# Patient Record
Sex: Male | Born: 1960 | Race: White | Hispanic: No | Marital: Single | Smoking: Current every day smoker
Health system: Southern US, Community
[De-identification: ages and names within clinical notes are randomized; demographics above are authoritative.]

---

## 2015-04-28 ENCOUNTER — Encounter: Payer: Self-pay | Admitting: Emergency Medicine

## 2015-04-28 ENCOUNTER — Inpatient Hospital Stay
Admission: EM | Admit: 2015-04-28 | Discharge: 2015-05-02 | DRG: 872 | Disposition: A | Discharge status: Home / Self Care | Payer: Self-pay | Attending: Internal Medicine | Admitting: Internal Medicine

## 2015-04-28 DIAGNOSIS — A408 Other streptococcal sepsis: Principal | ICD-10-CM | POA: Diagnosis present

## 2015-04-28 DIAGNOSIS — L03116 Cellulitis of left lower limb: Secondary | ICD-10-CM | POA: Insufficient documentation

## 2015-04-28 DIAGNOSIS — M869 Osteomyelitis, unspecified: Secondary | ICD-10-CM

## 2015-04-28 DIAGNOSIS — Z8249 Family history of ischemic heart disease and other diseases of the circulatory system: Secondary | ICD-10-CM

## 2015-04-28 DIAGNOSIS — F1020 Alcohol dependence, uncomplicated: Secondary | ICD-10-CM | POA: Diagnosis present

## 2015-04-28 DIAGNOSIS — L02416 Cutaneous abscess of left lower limb: Secondary | ICD-10-CM | POA: Diagnosis present

## 2015-04-28 DIAGNOSIS — Z833 Family history of diabetes mellitus: Secondary | ICD-10-CM

## 2015-04-28 DIAGNOSIS — L0390 Cellulitis, unspecified: Secondary | ICD-10-CM | POA: Diagnosis present

## 2015-04-28 DIAGNOSIS — F17210 Nicotine dependence, cigarettes, uncomplicated: Secondary | ICD-10-CM | POA: Diagnosis present

## 2015-04-28 DIAGNOSIS — A419 Sepsis, unspecified organism: Secondary | ICD-10-CM | POA: Insufficient documentation

## 2015-04-28 LAB — COMPREHENSIVE METABOLIC PANEL
ALT: 27 U/L (ref 17–63)
AST: 23 U/L (ref 15–41)
Albumin: 4 g/dL (ref 3.5–5.0)
Alkaline Phosphatase: 93 U/L (ref 38–126)
Anion gap: 9 (ref 5–15)
BILIRUBIN TOTAL: 1.2 mg/dL (ref 0.3–1.2)
BUN: 9 mg/dL (ref 6–20)
CALCIUM: 9.2 mg/dL (ref 8.9–10.3)
CO2: 26 mmol/L (ref 22–32)
Chloride: 103 mmol/L (ref 101–111)
Creatinine, Ser: 0.87 mg/dL (ref 0.61–1.24)
GFR calc Af Amer: >60 mL/min (ref >60)
GFR calc non Af Amer: >60 mL/min (ref >60)
GLUCOSE: 110 mg/dL — AB (ref 65–99)
Potassium: 3.6 mmol/L (ref 3.5–5.1)
Sodium: 138 mmol/L (ref 135–145)
Total Protein: 7.5 g/dL (ref 6.5–8.1)

## 2015-04-28 LAB — CBC WITH DIFFERENTIAL/PLATELET
BASOS ABS: 0 10*3/uL (ref 0–0.1)
Basophils Relative: 0 %
EOS PCT: 0 %
Eosinophils Absolute: 0 10*3/uL (ref 0–0.7)
HCT: 43.6 % (ref 40.0–52.0)
HEMOGLOBIN: 14.5 g/dL (ref 13.0–18.0)
Lymphocytes Relative: 10 %
Lymphs Abs: 1.6 10*3/uL (ref 1.0–3.6)
MCH: 30.3 pg (ref 26.0–34.0)
MCHC: 33.4 g/dL (ref 32.0–36.0)
MCV: 90.8 fL (ref 80.0–100.0)
MONOS PCT: 8 %
Monocytes Absolute: 1.3 10*3/uL — ABNORMAL HIGH (ref 0.2–1.0)
NEUTROS ABS: 12.8 10*3/uL — AB (ref 1.4–6.5)
Neutrophils Relative %: 82 %
Platelets: 276 10*3/uL (ref 150–440)
RBC: 4.79 MIL/uL (ref 4.40–5.90)
RDW: 13.5 % (ref 11.5–14.5)
WBC: 15.7 10*3/uL — ABNORMAL HIGH (ref 3.8–10.6)

## 2015-04-28 LAB — LACTIC ACID, PLASMA: LACTIC ACID, VENOUS: 1.4 mmol/L (ref 0.5–2.0)

## 2015-04-28 MED ORDER — SODIUM CHLORIDE 0.9 % IV SOLN
INTRAVENOUS | Status: AC
  Administered 2015-04-28: 23:00:00 via INTRAVENOUS

## 2015-04-28 MED ORDER — ACETAMINOPHEN 325 MG PO TABS: 650.0000 mg | ORAL_TABLET | Freq: Four times a day (QID) | ORAL | Status: DC | PRN

## 2015-04-28 MED ORDER — CLINDAMYCIN PHOSPHATE 600 MG/50ML IV SOLN
600.0000 mg | Freq: Once | INTRAVENOUS | Status: AC
  Administered 2015-04-28: 600 mg via INTRAVENOUS
  Filled 2015-04-28: qty 50

## 2015-04-28 MED ORDER — CLINDAMYCIN PHOSPHATE 600 MG/50ML IV SOLN
600.0000 mg | Freq: Three times a day (TID) | INTRAVENOUS | Status: DC
  Administered 2015-04-28: 600 mg via INTRAVENOUS
  Filled 2015-04-28 (×3): qty 50

## 2015-04-28 MED ORDER — ONDANSETRON HCL 4 MG/2ML IJ SOLN: 4.0000 mg | Freq: Four times a day (QID) | INTRAMUSCULAR | Status: DC | PRN

## 2015-04-28 MED ORDER — ONDANSETRON HCL 4 MG PO TABS: 4.0000 mg | ORAL_TABLET | Freq: Four times a day (QID) | ORAL | Status: DC | PRN

## 2015-04-28 MED ORDER — ALUM & MAG HYDROXIDE-SIMETH 200-200-20 MG/5ML PO SUSP: 30.0000 mL | Freq: Four times a day (QID) | ORAL | Status: DC | PRN

## 2015-04-28 MED ORDER — SODIUM CHLORIDE 0.9 % IV BOLUS (SEPSIS)
1000.0000 mL | Freq: Once | INTRAVENOUS | Status: AC
  Administered 2015-04-28: 1000 mL via INTRAVENOUS

## 2015-04-28 MED ORDER — ACETAMINOPHEN 650 MG RE SUPP: 650.0000 mg | Freq: Four times a day (QID) | RECTAL | Status: DC | PRN

## 2015-04-28 MED ORDER — HEPARIN SODIUM (PORCINE) 5000 UNIT/ML IJ SOLN
5000.0000 [IU] | Freq: Three times a day (TID) | INTRAMUSCULAR | Status: DC
  Administered 2015-04-28 – 2015-05-02 (×11): 5000 [IU] via SUBCUTANEOUS
  Filled 2015-04-28 (×11): qty 1

## 2015-04-28 MED ORDER — SENNOSIDES-DOCUSATE SODIUM 8.6-50 MG PO TABS
1.0000 | ORAL_TABLET | Freq: Every evening | ORAL | Status: DC | PRN
  Administered 2015-05-02: 1 via ORAL
  Filled 2015-04-28: qty 1

## 2015-04-28 MED ORDER — HYDROCODONE-ACETAMINOPHEN 5-325 MG PO TABS
1.0000 | ORAL_TABLET | ORAL | Status: DC | PRN
  Administered 2015-05-01 – 2015-05-02 (×2): 2 via ORAL
  Filled 2015-04-28 (×2): qty 2

## 2015-04-28 MED ORDER — SODIUM CHLORIDE 0.9 % IV BOLUS (SEPSIS)
1267.0000 mL | Freq: Once | INTRAVENOUS | Status: AC
  Administered 2015-04-28: 1267 mL via INTRAVENOUS

## 2015-04-28 MED ORDER — SODIUM CHLORIDE 0.9 % IV BOLUS (SEPSIS)
1000.0000 mL | Freq: Once | INTRAVENOUS | Status: AC
Start: 2015-04-28 — End: 2015-04-28
  Administered 2015-04-28: 1000 mL via INTRAVENOUS

## 2015-04-28 NOTE — Progress Notes (Signed)
   04/28/15 1800  Clinical Encounter Type  Visited With Patient  Visit Type Spiritual support  Spiritual Encounters  Spiritual Needs Emotional  Stress Factors  Patient Stress Factors Health changes   Status: Cellulitis, 54 yr old male Family: brother in [place] Age/Sex: male 52 yrs Visit Assessment: The chaplain visited with patient. He shared that he was not the type to sit around and that he is hungry. His sentiments were shared with the nursing station.  Pastoral Care: [phone] pager or by available online

## 2015-04-28 NOTE — Consult Note (Signed)
Surgical Consultation  04/28/2015  Keith Johnston is an 54 y.o. male.   CC: Left leg cellulitis  HPI:  This patient with a history of a fall on Saturday night landing on a concrete step and scraping his anterior left leg. He states that he was able to work on Sunday and Monday but he started having worsening pain and redness and came to the emergency room where her cellulitis was diagnosed. Fevers or chills since it is hard to stand on the leg. She had an episode like this before.  History reviewed. No pertinent past medical history.  History reviewed. No pertinent past surgical history.  No family history on file.  Social History:  reports that he has been smoking Cigarettes.  He has been smoking about 1.00 pack per day. He does not have any smokeless tobacco history on file. He reports that he drinks about 7.2 oz of alcohol per week. His drug history is not on file.  Allergies: No Known Allergies  Medications reviewed.   Review of Systems:   Review of Systems  Constitutional: Negative for fever and chills.  HENT: Negative.   Eyes: Negative.   Respiratory: Negative.   Cardiovascular: Negative.   Gastrointestinal: Negative.   Genitourinary: Negative.   Musculoskeletal: Negative.   Skin:       Redness of the left anterior leg  Neurological: Negative.  Negative for weakness.  Endo/Heme/Allergies: Negative.   Psychiatric/Behavioral: Negative.      Physical Exam:  BP 161/96 mmHg  Pulse 97  Temp(Src) 98.4 F (36.9 C) (Oral)  Resp 18  Ht '6\' 6"'  (1.981 m)  Wt 240 lb (108.863 kg)  BMI 27.74 kg/m2  SpO2 100%  Physical Exam  Constitutional: He is well-developed, well-nourished, and in no distress.  HENT:  Head: Normocephalic and atraumatic.  Mild strabismus left  Eyes: No scleral icterus.  Cardiovascular: Normal rate and regular rhythm.   Pulmonary/Chest: Effort normal. No respiratory distress.  Abdominal: Soft. He exhibits no distension.  Musculoskeletal: He exhibits  edema and tenderness.  Left anterior leg with 2 half to 3 cm abrasion minimal purulence no expressible purulence very superficial in nature. He also has considerable redness and erythema extending circumferentially around the lower leg from the ankle to just above the knee there is no lymphangitis in the medial side of the thigh. Calf is nontender negative Homans sign moderate edema  Skin: Skin is warm. No rash noted. There is erythema. No pallor.  Psychiatric: Mood, affect and judgment normal.      Results for orders placed or performed during the hospital encounter of 04/28/15 (from the past 48 hour(s))  CBC with Differential     Status: Abnormal   Collection Time: 04/28/15  3:37 PM  Result Value Ref Range   WBC 15.7 (H) 3.8 - 10.6 K/uL   RBC 4.79 4.40 - 5.90 MIL/uL   Hemoglobin 14.5 13.0 - 18.0 g/dL   HCT 43.6 40.0 - 52.0 %   MCV 90.8 80.0 - 100.0 fL   MCH 30.3 26.0 - 34.0 pg   MCHC 33.4 32.0 - 36.0 g/dL   RDW 13.5 11.5 - 14.5 %   Platelets 276 150 - 440 K/uL   Neutrophils Relative % 82 %   Neutro Abs 12.8 (H) 1.4 - 6.5 K/uL   Lymphocytes Relative 10 %   Lymphs Abs 1.6 1.0 - 3.6 K/uL   Monocytes Relative 8 %   Monocytes Absolute 1.3 (H) 0.2 - 1.0 K/uL   Eosinophils Relative 0 %  Eosinophils Absolute 0.0 0 - 0.7 K/uL   Basophils Relative 0 %   Basophils Absolute 0.0 0 - 0.1 K/uL  Comprehensive metabolic panel     Status: Abnormal   Collection Time: 04/28/15  3:37 PM  Result Value Ref Range   Sodium 138 135 - 145 mmol/L   Potassium 3.6 3.5 - 5.1 mmol/L   Chloride 103 101 - 111 mmol/L   CO2 26 22 - 32 mmol/L   Glucose, Bld 110 (H) 65 - 99 mg/dL   BUN 9 6 - 20 mg/dL   Creatinine, Ser 0.87 0.61 - 1.24 mg/dL   Calcium 9.2 8.9 - 10.3 mg/dL   Total Protein 7.5 6.5 - 8.1 g/dL   Albumin 4.0 3.5 - 5.0 g/dL   AST 23 15 - 41 U/L   ALT 27 17 - 63 U/L   Alkaline Phosphatase 93 38 - 126 U/L   Total Bilirubin 1.2 0.3 - 1.2 mg/dL   GFR calc non Af Amer >60 >60 mL/min   GFR calc Af  Amer >60 >60 mL/min    Comment: (NOTE) The eGFR has been calculated using the CKD EPI equation. This calculation has not been validated in all clinical situations. eGFR's persistently <60 mL/min signify possible Chronic Kidney Disease.    Anion gap 9 5 - 15  Lactic acid, plasma     Status: None   Collection Time: 04/28/15  5:38 PM  Result Value Ref Range   Lactic Acid, Venous 1.4 0.5 - 2.0 mmol/L   No results found.  Assessment/Plan:  Cellulitis of the left anterior leg secondary to a fall. This is a very superficial wound and I see no sign of a deep infection will range of motion is normal. I agree with IV anabiotic's and observation at this point with elevation of his leg we will follow him while he is in the hospital.  Florene Glen, MD, FACS

## 2015-04-28 NOTE — ED Provider Notes (Signed)
-----------------------------------------   5:51 PM on 04/28/2015 -----------------------------------------  Care was assumed from NP Triplett. Patient with sepsis secondary to left lower extremity cellulitis. Vitals improving with 30 ml/kg bolus normal saline, clindamycin. No evidence to suggest abscess. No evidence to suggest acute arterial thrombosis/vascular compromise. Case discussed with hospitalist for admission at this time.  Gayla Doss, MD 04/28/15 [phone]

## 2015-04-28 NOTE — ED Provider Notes (Signed)
[facility] Emergency Department Provider Note ____________________________________________  Time seen: Approximately 3:32 PM  I have reviewed the triage vital signs and the nursing notes.   HISTORY  Chief Complaint Wound Infection   HPI Keith Johnston is a 54 y.o. male who presents to the emergency department for evaluation of an infection of his left lower extremity. He states that he hit his leg on the corner of concrete a few days ago. He states that the leg was painful but he didn't think much about it until yesterday when he noticed that it had become swollen and red. He states that today the area has started to drain foul-smelling pus. He states that his entire lower extremity is painful.   History reviewed. No pertinent past medical history.  Patient Active Problem List   Diagnosis Date Noted  . Cellulitis 04/28/2015    History reviewed. No pertinent past surgical history.  No current outpatient prescriptions on file.  Allergies Review of patient's allergies indicates no known allergies.  No family history on file.  Social History History  Substance Use Topics  . Smoking status: Current Every Day Smoker -- 1.00 packs/day    Types: Cigarettes  . Smokeless tobacco: Not on file  . Alcohol Use: 7.2 oz/week    12 Cans of beer per week    Review of Systems   Constitutional: No fever/chills Eyes: No visual changes. ENT: No congestion or rhinorrhea Cardiovascular: Denies chest pain. Respiratory: Denies shortness of breath. Gastrointestinal: No abdominal pain.  No nausea, no vomiting.  No diarrhea.  No constipation. Genitourinary: Negative for dysuria. Musculoskeletal: Negative for back pain. Skin: Infection to the left lower extremity Neurological: Negative for headaches, focal weakness or numbness.  10-point ROS otherwise negative.  ____________________________________________   PHYSICAL EXAM:  VITAL SIGNS: ED Triage Vitals   Enc Vitals Group     BP --      Pulse --      Resp --      Temp --      Temp src --      SpO2 --      Weight --      Height --      Head Cir --      Peak Flow --      Pain Score 04/28/15 1419 9     Pain Loc --      Pain Edu? --      Excl. in GC? --    Constitutional: Alert and oriented. Well appearing and in no acute distress. Eyes: Conjunctivae are normal. PERRL. EOMI. Head: Atraumatic. Nose: No congestion/rhinnorhea. Mouth/Throat: Mucous membranes are moist.  Oropharynx non-erythematous. No oral lesions. Neck: No stridor. Cardiovascular: Normal rate, regular rhythm.  Good peripheral circulation. Respiratory: Normal respiratory effort.  No retractions. Lungs CTAB. Gastrointestinal: Soft and nontender. No distention. No abdominal bruits.  Musculoskeletal:  No joint effusions. Limited ROM of LLE due to swelling and pain. Swelling extends from below knee to toes. Neurologic:  Normal speech and language. No gross focal neurologic deficits are appreciated. Speech is normal. No gait instability. Skin:  Erythematous, taught below knee with obvious cellulitis. Wound to the pretibial area open and draining purulent and serous fluid.  Psychiatric: Mood and affect are normal. Speech and behavior are normal.  ____________________________________________   LABS (all labs ordered are listed, but only abnormal results are displayed)  Labs Reviewed  CBC WITH DIFFERENTIAL/PLATELET - Abnormal; Notable for the following:    WBC 15.7 (*)  Neutro Abs 12.8 (*)    Monocytes Absolute 1.3 (*)    All other components within normal limits  COMPREHENSIVE METABOLIC PANEL - Abnormal; Notable for the following:    Glucose, Bld 110 (*)    All other components within normal limits  CULTURE, BLOOD (ROUTINE X 2)  CULTURE, BLOOD (ROUTINE X 2)  WOUND CULTURE  LACTIC ACID, PLASMA  BASIC METABOLIC PANEL  CBC    ____________________________________________  EKG   ____________________________________________  RADIOLOGY   ____________________________________________   PROCEDURES  Procedure(s) performed: None ____________________________________________   INITIAL IMPRESSION / ASSESSMENT AND PLAN / ED COURSE  Pertinent labs & imaging results that were available during my care of the patient were reviewed by me and considered in my medical decision making (see chart for details).  1612: Temp increased. NS infusing as well as Clindamycin. Wound culture and blood cultures sent prior to start of antibiotics.   1645: Patient was transported to room 10 for further evaluation by Dr. Inocencio Homes. ____________________________________________   FINAL CLINICAL IMPRESSION(S) / ED DIAGNOSES  Final diagnoses:  None       Chinita Pester, FNP 04/28/15 2114  Gayla Doss, MD 04/29/15 1610

## 2015-04-28 NOTE — H&P (Addendum)
[facility] - [place] at [facility]   PATIENT NAME: Keith Johnston    MR#:  [id number]  DATE OF BIRTH:  09/04/1961  DATE OF ADMISSION:  04/28/2015  PRIMARY CARE PHYSICIAN: No primary care provider on file.   REQUESTING/REFERRING PHYSICIAN: Dr Inocencio Homes  CHIEF COMPLAINT:  Cellulitis left lower extremity  HISTORY OF PRESENT ILLNESS:  Keith Johnston  is a 54 y.o. male with a known history of tobacco dependence and alcohol dependence who presents with above complaint. Patient says he fell and hit his leg on the concrete on Saturday and over the past 2 days has noted increased pain, tenderness, edema and purulent drainage from a scab from the fall on his left leg. Patient also reporting a fever. Patient denies chest pain or shortness of breath.  PAST MEDICAL HISTORY:  Tobacco dependence Alcohol dependence  PAST SURGICAL HISTORY:  None  SOCIAL HISTORY:   History  Substance Use Topics  . Smoking status: Current Every Day Smoker -- 1.00 packs/day    Types: Cigarettes  . Smokeless tobacco: Not on file  . Alcohol Use: 7.2 oz/week    12 Cans of beer per week    FAMILY HISTORY:  Hypertension Diabetes  DRUG ALLERGIES:  Allergies no known allergies   REVIEW OF SYSTEMS:  CONSTITUTIONAL: + fever, no fatigue or weakness.  EYES: No blurred or double vision.  EARS, NOSE, AND THROAT: No tinnitus or ear pain.  RESPIRATORY: No cough, shortness of breath, wheezing or hemoptysis.  CARDIOVASCULAR: No chest pain, orthopnea, edema.  GASTROINTESTINAL: No nausea, vomiting, diarrhea or abdominal pain.  GENITOURINARY: No dysuria, hematuria.  ENDOCRINE: No polyuria, nocturia,  HEMATOLOGY: No anemia, easy bruising or bleeding SKIN: Positive left lower extremity cellulitis with erythema and tenderness MUSCULOSKELETAL: No joint pain or arthritis.   NEUROLOGIC: No tingling, numbness, weakness.  PSYCHIATRY: No anxiety or depression.   MEDICATIONS AT HOME:   Prior to Admission  medications   Not on File      VITAL SIGNS:  Blood pressure 137/79, pulse 101, temperature 100.4 F (38 C), temperature source Oral, resp. rate 15, height  (1.981 m), weight 108.863 kg (240 lb), SpO2 100 %.  PHYSICAL EXAMINATION:  GENERAL:  54 y.o.-year-old patient lying in the bed with no acute distress.  EYES: Pupils equal, round, reactive to light and accommodation. No scleral icterus. Extraocular muscles intact.  HEENT: Head atraumatic, normocephalic. Oropharynx and nasopharynx clear.  NECK:  Supple, no jugular venous distention. No thyroid enlargement, no tenderness.  LUNGS: Normal breath sounds bilaterally, no wheezing, rales,rhonchi or crepitation. No use of accessory muscles of respiration.  CARDIOVASCULAR: S1, S2 normal. No murmurs, rubs, or gallops.  ABDOMEN: Soft, nontender, nondistended. Bowel sounds present. No organomegaly or mass.  EXTREMITIES: No pedal edema, cyanosis, or clubbing.  NEUROLOGIC: Cranial nerves II through XII are grossly intact. No focal deficits. PSYCHIATRIC: The patient is alert and oriented x 3.  SKIN: Left lower extremity is positive for cellulitis with a large area of erythema extending up to the knee. He has a abscess with purulent drainage. The cellulitis is tender to touch, erythematous and warm.   LABORATORY PANEL:   CBC  Recent Labs Lab 04/28/15 1537  WBC 15.7*  HGB 14.5  HCT 43.6  PLT 276   ------------------------------------------------------------------------------------------------------------------  Chemistries   Recent Labs Lab 04/28/15 1537  NA 138  K 3.6  CL 103  CO2 26  GLUCOSE 110*  BUN 9  CREATININE 0.87  CALCIUM 9.2  AST 23  ALT  27  ALKPHOS 93  BILITOT 1.2   ------------------------------------------------------------------------------------------------------------------  Cardiac Enzymes No results for input(s): TROPONINI in the last 168  hours. ------------------------------------------------------------------------------------------------------------------  RADIOLOGY:  No results found.  EKG:  None  IMPRESSION AND PLAN:  54 year old male with our call and tobacco dependence who is status post mechanical fall subsequently had an injury to the left lower extremity and now has purulent drainage from the abscess that has formed along with cellulitis.   1. Sepsis: Patient meets criteria for sepsis with cellulitis and tachycardia, fever and leukocytosis. 2. Cellulitis with abscess: Patient's abscess is currently draining some purulent discharge. Wound culture has been taken. I will have surgery see the patient to see if the patient needs further invasive I and D. Patient will continue on clindamycin. Blood and wound culture will be followed up.  3. EtOh call dependence: Patient drinks 12 pack of beer a week. He denies going through withdrawals. At this time patient does not need CIWA.  4. Tobacco dependence: Patient was encouraged to stop smoking. He states that he does want to quit smoking. He does not want a nicotine patch. Patient was counseled for 3 minutes.    All the records are reviewed and case discussed with ED provider. Management plans discussed with the patient and he is in agreement.  CODE STATUS: FULL  TOTAL TIME TAKING CARE OF THIS PATIENT: 45 minutes.    Keith Johnston M.D on 04/28/2015 at 5:51 PM  Between 7am to 6pm - Pager - [phone] After 6pm go to [email or web address] - password [email or web address] [facility]  [place] [facility]  Office  [phone]  CC: Primary care physician; No primary care provider on file.

## 2015-04-28 NOTE — ED Notes (Signed)
Pt reports tripping over step at home and "busted leg up" on corner of the concrete.  Pt reports increasing pain over the last few days and now with odorous pus/drainage from wound.  Pt went to Urgent Care, from which he was sent to ED.

## 2015-04-29 LAB — BASIC METABOLIC PANEL
Anion gap: 7 (ref 5–15)
BUN: 7 mg/dL (ref 6–20)
CO2: 22 mmol/L (ref 22–32)
Calcium: 8.4 mg/dL — ABNORMAL LOW (ref 8.9–10.3)
Chloride: 111 mmol/L (ref 101–111)
Creatinine, Ser: 0.75 mg/dL (ref 0.61–1.24)
GFR calc Af Amer: >60 mL/min (ref >60)
GFR calc non Af Amer: >60 mL/min (ref >60)
Glucose, Bld: 104 mg/dL — ABNORMAL HIGH (ref 65–99)
Potassium: 3.6 mmol/L (ref 3.5–5.1)
SODIUM: 140 mmol/L (ref 135–145)

## 2015-04-29 LAB — CBC
HCT: 38.9 % — ABNORMAL LOW (ref 40.0–52.0)
HEMOGLOBIN: 12.8 g/dL — AB (ref 13.0–18.0)
MCH: 30.4 pg (ref 26.0–34.0)
MCHC: 33 g/dL (ref 32.0–36.0)
MCV: 92.2 fL (ref 80.0–100.0)
PLATELETS: 252 10*3/uL (ref 150–440)
RBC: 4.22 MIL/uL — AB (ref 4.40–5.90)
RDW: 13.5 % (ref 11.5–14.5)
WBC: 14.7 10*3/uL — ABNORMAL HIGH (ref 3.8–10.6)

## 2015-04-29 MED ORDER — PIPERACILLIN-TAZOBACTAM 3.375 G IVPB
3.3750 g | Freq: Three times a day (TID) | INTRAVENOUS | Status: DC
  Administered 2015-04-29 – 2015-05-02 (×10): 3.375 g via INTRAVENOUS
  Filled 2015-04-29 (×14): qty 50

## 2015-04-29 MED ORDER — VANCOMYCIN HCL 10 G IV SOLR: 2000.0000 mg | Freq: Once | INTRAVENOUS | Status: DC

## 2015-04-29 MED ORDER — VANCOMYCIN HCL 10 G IV SOLR
1500.0000 mg | Freq: Once | INTRAVENOUS | Status: AC
  Administered 2015-04-29: 1500 mg via INTRAVENOUS
  Filled 2015-04-29: qty 1500

## 2015-04-29 MED ORDER — VANCOMYCIN HCL 10 G IV SOLR: 1500.0000 mg | Freq: Three times a day (TID) | INTRAVENOUS | Status: DC

## 2015-04-29 MED ORDER — VANCOMYCIN HCL 10 G IV SOLR
1500.0000 mg | Freq: Three times a day (TID) | INTRAVENOUS | Status: DC
  Administered 2015-04-29 – 2015-05-02 (×9): 1500 mg via INTRAVENOUS
  Filled 2015-04-29 (×13): qty 1500

## 2015-04-29 NOTE — Progress Notes (Signed)
ANTIBIOTIC CONSULT NOTE - INITIAL  Pharmacy Consult for Vancomycin and Zosyn Indication: Sepsis/Cellulitis  No Known Allergies  Patient Measurements: Height:  (198.1 cm) Weight: 240 lb (108.863 kg) IBW/kg (Calculated) : 91.4 Adjusted Body Weight: 98.4 kg  Vital Signs: Temp: 100.2 F (37.9 C) (08/04 0226) Temp Source: Oral (08/04 0226) BP: 115/56 mmHg (08/03 2357) Pulse Rate: 88 (08/03 2357) Intake/Output from previous day: 08/03 0701 - 08/04 0700 In: 1845 [I.V.:528; IV Piggyback:1317] Out: 975 [Urine:975] Intake/Output from this shift: Total I/O In: 1845 [I.V.:528; IV Piggyback:1317] Out: 975 [Urine:975]  Labs:  Recent Labs  04/28/15 1537  WBC 15.7*  HGB 14.5  PLT 276  CREATININE 0.87   Estimated Creatinine Clearance: 128.4 mL/min (by C-G formula based on Cr of 0.87). No results for input(s): VANCOTROUGH, VANCOPEAK, VANCORANDOM, GENTTROUGH, GENTPEAK, GENTRANDOM, TOBRATROUGH, TOBRAPEAK, TOBRARND, AMIKACINPEAK, AMIKACINTROU, AMIKACIN in the last 72 hours.   Microbiology: No results found for this or any previous visit (from the past 720 hour(s)).  Medical History: History reviewed. No pertinent past medical history.  Medications:  Infusions:  . sodium chloride 75 mL/hr at 04/28/15 2246   Assessment: 53 yom with cellulitis initially on clindamycin developed fever/tachycardia so spectrum was broadened to vanco/Zosyn.  Goal of Therapy:  Vancomycin trough level 15-20 mcg/ml  Plan:  Expected duration 7 days with resolution of temperature and/or normalization of WBC Measure antibiotic drug levels at steady state Follow up culture results  Start Zosyn 3.375 gm IV Q8H over 240 minutes and vancomycin 1.5 gm IV Q8H with initial stacked dose. Will follow vancomycin trough and adjust as needed to maintain trough 15 to 20 mcg/mL.  Marlyne Totaro A Ercole Georg 04/29/2015,2:54 AM

## 2015-04-29 NOTE — Care Management (Signed)
This RNCM went in to speak with patient regarding health care insurance as he is listed at self- pay. He was sleeping. I left applications for Med management/[facility] at bedside- no referral made because I was unable to assess patient's discharge needs. I rounded again on patient because his IV pump was beeping and he was agitated that he "keeps getting woke up". I explained my role and left my contact card at bedside. RNCM will follow for Rx needs. [facility] will not see patient without a referral for which I do not have patient's permission. RN notified of IVF need.

## 2015-04-29 NOTE — Progress Notes (Signed)
Patient alert and oriented, vital signs stable.  No PRN meds needed this shift.  Patient currently receiving vanc and zosyn.  Urinal to void.  Patient currently resting in no apparent distress.  See [facility] for further details.

## 2015-04-29 NOTE — Progress Notes (Signed)
T-max 101.1, Dr. Anne Hahn made aware. IV abx changed to zosyn and vancomycin. Will cont to monitor.

## 2015-04-29 NOTE — Progress Notes (Signed)
[facility] - [place] at [facility]   PATIENT NAME: Keith Johnston    MR#:  [id number]  DATE OF BIRTH:  Sep 11, 1961  SUBJECTIVE:  Patient had fever last night. His antibiotics were changed from clindamycin to vancomycin and Zosyn. Patient was seen and evaluated by surgery yesterday and their recommendations were no further surgical incision and drainage at this point. Patient's cellulitis has somewhat improved with antibiotics and elevating the leg. Patient has no further complaints at this time.  REVIEW OF SYSTEMS:    Review of Systems  Constitutional: Positive for fever. Negative for chills and malaise/fatigue.  HENT: Negative for hearing loss and sore throat.   Eyes: Negative for blurred vision.  Respiratory: Negative for cough, hemoptysis, shortness of breath and wheezing.   Cardiovascular: Negative for chest pain, palpitations and leg swelling.  Gastrointestinal: Negative for nausea, vomiting, abdominal pain, diarrhea and blood in stool.  Genitourinary: Negative for dysuria.  Musculoskeletal: Negative for back pain.  Skin:       Left leg cellulitis  Neurological: Negative for dizziness, tremors and headaches.  Endo/Heme/Allergies: Does not bruise/bleed easily.    Tolerating Diet: Yes      DRUG ALLERGIES:  No Known Allergies  VITALS:  Blood pressure 123/70, pulse 88, temperature 99.3 F (37.4 C), temperature source Oral, resp. rate 18, height 6\' 6"  (1.981 m), weight 108.863 kg (240 lb), SpO2 98 %.  PHYSICAL EXAMINATION:   Physical Exam  Constitutional: He is oriented to person, place, and time and well-developed, well-nourished, and in no distress. No distress.  HENT:  Head: Normocephalic.  Eyes: No scleral icterus.  Neck: Normal range of motion. Neck supple. No JVD present. No tracheal deviation present.  Cardiovascular: Normal rate, regular rhythm and normal heart sounds.  Exam reveals no gallop and no friction rub.   No murmur  heard. Pulmonary/Chest: Effort normal and breath sounds normal. No respiratory distress. He has no wheezes. He has no rales. He exhibits no tenderness.  Abdominal: Soft. Bowel sounds are normal. He exhibits no distension and no mass. There is no tenderness. There is no rebound and no guarding.  Musculoskeletal: Normal range of motion. He exhibits no edema.  Neurological: He is alert and oriented to person, place, and time.  Skin: Skin is warm. No rash noted. There is erythema.  Patient has left leg cellulitis which is much improved since yesterday. Patient has some very minimal drainage from the abscess on his left shin as well.  Psychiatric: Affect and judgment normal.      LABORATORY PANEL:   CBC  Recent Labs Lab 04/29/15 0450  WBC 14.7*  HGB 12.8*  HCT 38.9*  PLT 252   ------------------------------------------------------------------------------------------------------------------  Chemistries   Recent Labs Lab 04/28/15 1537 04/29/15 0450  NA 138 140  K 3.6 3.6  CL 103 111  CO2 26 22  GLUCOSE 110* 104*  BUN 9 7  CREATININE 0.87 0.75  CALCIUM 9.2 8.4*  AST 23  --   ALT 27  --   ALKPHOS 93  --   BILITOT 1.2  --    ------------------------------------------------------------------------------------------------------------------  Cardiac Enzymes No results for input(s): TROPONINI in the last 168 hours. ------------------------------------------------------------------------------------------------------------------  RADIOLOGY:  No results found.   ASSESSMENT AND PLAN:   54 year old male with EtOH and tobacco dependence status post mechanical fall and subsequently has suffered a left leg cellulitis.  1. Left lower extremity cellulitis from a mechanical fall: Patient was seen and evaluated by surgery. No indication for incision and drainage  at this time. Patient has a small abrasion on his left shin without purulent discharge swelling. Patient will continue on  vancomycin and Zosyn. We will continue to follow wound and blood cultures which are thus far pending. Cellulitis seems to be resolving.  2. EtOH dependence: Patient does not appear to be in withdrawal.  3. Tobacco dependence: Patient was counseled on admission. We encouraged the patient to quit smoking. 4. Sepsis: Patient meets sepsis criteria by leukocytosis and fever along with tachycardia. His sepsis secondary to problem #1. Continue management as indicated above.     Management plans discussed with the patient and he is in agreement.  CODE STATUS: Full  TOTAL TIME TAKING CARE OF THIS PATIENT: 35 minutes.     POSSIBLE D/C 1-2 days, DEPENDING ON CLINICAL CONDITION.   Bettylou Frew M.D on 04/29/2015 at 11:39 AM  Between 7am to 6pm - Pager - [phone] After 6pm go to [email or web address] - password [email or web address] [email or web address]  [place]  [facility]  Office  [phone]  CC: Primary care physician; No primary care provider on file.

## 2015-04-29 NOTE — Progress Notes (Signed)
Update:  Informed by nursing that patient is still spiking fevers on IV clindamycin.  Will broaden abx to vancomycin and zosyn for now as he meets sepsis criteria with leukocytosis and fever, and tailor per blood and wound culture sensitivities when available.  Kristeen Miss [facility] 04/29/2015, 2:36 AM

## 2015-04-29 NOTE — Plan of Care (Signed)
Problem: Phase I Progression Outcomes Goal: Pain controlled with appropriate interventions Outcome: Progressing Pt denied need for prn med, stated leg only hurts with ambulation. Goal: OOB as tolerated unless otherwise ordered Outcome: Progressing Pt ambulated to bathroom independently.  Goal: Voiding-avoid urinary catheter unless indicated Outcome: Completed/Met Date Met:  04/29/15 Pt voiding without difficulty.  Goal: Hemodynamically stable Outcome: Progressing WBC trending down, pt febrile overnight. Dr Jannifer Franklin aware, abx changed to vanco and zosyn.

## 2015-04-30 ENCOUNTER — Inpatient Hospital Stay: Payer: Self-pay

## 2015-04-30 DIAGNOSIS — L03116 Cellulitis of left lower limb: Secondary | ICD-10-CM | POA: Insufficient documentation

## 2015-04-30 LAB — CBC
HCT: 36.3 % — ABNORMAL LOW (ref 40.0–52.0)
HEMOGLOBIN: 12.2 g/dL — AB (ref 13.0–18.0)
MCH: 30.8 pg (ref 26.0–34.0)
MCHC: 33.6 g/dL (ref 32.0–36.0)
MCV: 91.5 fL (ref 80.0–100.0)
PLATELETS: 267 10*3/uL (ref 150–440)
RBC: 3.96 MIL/uL — AB (ref 4.40–5.90)
RDW: 13.6 % (ref 11.5–14.5)
WBC: 10.5 10*3/uL (ref 3.8–10.6)

## 2015-04-30 LAB — BASIC METABOLIC PANEL
Anion gap: 7 (ref 5–15)
BUN: 10 mg/dL (ref 6–20)
CALCIUM: 8.4 mg/dL — AB (ref 8.9–10.3)
CO2: 23 mmol/L (ref 22–32)
Chloride: 111 mmol/L (ref 101–111)
Creatinine, Ser: 0.77 mg/dL (ref 0.61–1.24)
GFR calc Af Amer: >60 mL/min (ref >60)
GFR calc non Af Amer: >60 mL/min (ref >60)
GLUCOSE: 115 mg/dL — AB (ref 65–99)
Potassium: 3.7 mmol/L (ref 3.5–5.1)
Sodium: 141 mmol/L (ref 135–145)

## 2015-04-30 LAB — VANCOMYCIN, TROUGH: Vancomycin Tr: 18 ug/mL (ref 10–20)

## 2015-04-30 MED ORDER — GADOBENATE DIMEGLUMINE 529 MG/ML IV SOLN
20.0000 mL | Freq: Once | INTRAVENOUS | Status: AC | PRN
  Administered 2015-04-30: 20 mL via INTRAVENOUS

## 2015-04-30 NOTE — Progress Notes (Signed)
ANTIBIOTIC CONSULT NOTE -FOLLOW UP   Pharmacy Consult for Vancomycin and Zosyn Indication: Sepsis/Cellulitis  No Known Allergies  Patient Measurements: Height:  (198.1 cm) Weight: 240 lb (108.863 kg) IBW/kg (Calculated) : 91.4 Adjusted Body Weight: 98.4 kg  Vital Signs: Temp: 98.6 F (37 C) (08/05 0722) Temp Source: Oral (08/05 0722) BP: 138/71 mmHg (08/05 0722) Pulse Rate: 81 (08/05 0722) Intake/Output from previous day: 08/04 0701 - 08/05 0700 In: 2630 [P.O.:720; I.V.:910; IV Piggyback:1000] Out: 3175 [Urine:3175] Intake/Output from this shift: Total I/O In: 240 [P.O.:240] Out: 400 [Urine:400]  Labs:  Recent Labs  04/28/15 1537 04/29/15 0450 04/30/15 0511  WBC 15.7* 14.7* 10.5  HGB 14.5 12.8* 12.2*  PLT 276 252 267  CREATININE 0.87 0.75 0.77   Estimated Creatinine Clearance: 139.6 mL/min (by C-G formula based on Cr of 0.77).  Recent Labs  04/30/15 0915  [facility] 18     Microbiology: Recent Results (from the past 720 hour(s))  Blood culture (routine x 2)     Status: None (Preliminary result)   Collection Time: 04/28/15  3:37 PM  Result Value Ref Range Status   Specimen Description BLOOD LEFT ASSIST CONTROL  Final   Special Requests BOTTLES DRAWN AEROBIC AND ANAEROBIC  Final   Culture NO GROWTH 2 DAYS  Final   Report Status PENDING  Incomplete  Blood culture (routine x 2)     Status: None (Preliminary result)   Collection Time: 04/28/15  3:37 PM  Result Value Ref Range Status   Specimen Description BLOOD RIGHT ASSIST CONTROL  Final   Special Requests BOTTLES DRAWN AEROBIC AND ANAEROBIC  Final   Culture NO GROWTH 2 DAYS  Final   Report Status PENDING  Incomplete  Wound culture     Status: None (Preliminary result)   Collection Time: 04/28/15  3:37 PM  Result Value Ref Range Status   Specimen Description ABSCESS  Final   Special Requests NONE  Final   Gram Stain   Final    MODERATE WBC SEEN MODERATE GRAM NEGATIVE RODS FEW GRAM  POSITIVE COCCI    Culture HOLDING FOR POSSIBLE PATHOGEN  Final   Report Status PENDING  Incomplete    Medical History: History reviewed. No pertinent past medical history.  Medications:  Infusions:    Assessment: 53 yom with cellulitis initially on clindamycin developed fever/tachycardia so spectrum was broadened to vanco/Zosyn.  Goal of Therapy:  Vancomycin trough level 15-20 mcg/ml  Plan:  Expected duration 7 days with resolution of temperature and/or normalization of WBC Measure antibiotic drug levels at steady state Follow up culture results  Start Zosyn 3.375 gm IV Q8H over 240 minutes and vancomycin 1.5 gm IV Q8H with initial stacked dose. Will follow vancomycin trough and adjust as needed to maintain trough 15 to 20 mcg/mL.  Trough level resulted @ 18 mcg/ml. Will continue current regimen of Vancomycin 1.5 g iv q8 hours.   Marbella Markgraf D 04/30/2015,10:16 AM

## 2015-04-30 NOTE — Progress Notes (Signed)
Dr.Mody paged, call back, informed of patient's lle- increase swelling and redness. Spoke to MD regarding ultrasound for lle. No new orders given, waiting to see MRI results.

## 2015-04-30 NOTE — Progress Notes (Signed)
[facility] - [place] at [facility]   PATIENT NAME: Keith Johnston    MR#:  [id number]  DATE OF BIRTH:  1960/10/19  SUBJECTIVE:  It appears the patient's left lower external he cellulitis has spread overnight. Patient is continue to have some pain as well as purulent discharge from the left lower extremity.Marland Kitchen  REVIEW OF SYSTEMS:    Review of Systems  Constitutional: Negative for fever, chills and malaise/fatigue.  HENT: Negative for hearing loss and sore throat.   Eyes: Negative for blurred vision.  Respiratory: Negative for cough, hemoptysis, shortness of breath and wheezing.   Cardiovascular: Negative for chest pain, palpitations and leg swelling.  Gastrointestinal: Negative for nausea, vomiting, abdominal pain, diarrhea and blood in stool.  Genitourinary: Negative for dysuria.  Musculoskeletal: Negative for back pain.       Leg pain cellulitis worsened over night  Skin:       Left leg cellulitis  Neurological: Negative for dizziness, tremors and headaches.  Endo/Heme/Allergies: Does not bruise/bleed easily.    Tolerating Diet: Yes      DRUG ALLERGIES:  No Known Allergies  VITALS:  Blood pressure 138/71, pulse 81, temperature 98.6 F (37 C), temperature source Oral, resp. rate 16, height 6\' 6"  (1.981 m), weight 108.863 kg (240 lb), SpO2 99 %.  PHYSICAL EXAMINATION:   Physical Exam  Constitutional: He is oriented to person, place, and time and well-developed, well-nourished, and in no distress. No distress.  HENT:  Head: Normocephalic.  Eyes: No scleral icterus.  Neck: Normal range of motion. Neck supple. No JVD present. No tracheal deviation present.  Cardiovascular: Normal rate, regular rhythm and normal heart sounds.  Exam reveals no gallop and no friction rub.   No murmur heard. Pulmonary/Chest: Effort normal and breath sounds normal. No respiratory distress. He has no wheezes. He has no rales. He exhibits no tenderness.  Abdominal:  Soft. Bowel sounds are normal. He exhibits no distension and no mass. There is no tenderness. There is no rebound and no guarding.  Musculoskeletal: Normal range of motion. He exhibits no edema.  Neurological: He is alert and oriented to person, place, and time.  Skin: Skin is warm. No rash noted. There is erythema.  His left lower extremity cellulitis actually has increased. It is spread to his foot and up his thigh. He continues up purulent discharge. It is very tender to touch and warm.  Psychiatric: Affect and judgment normal.      LABORATORY PANEL:   CBC  Recent Labs Lab 04/30/15 0511  WBC 10.5  HGB 12.2*  HCT 36.3*  PLT 267   ------------------------------------------------------------------------------------------------------------------  Chemistries   Recent Labs Lab 04/28/15 1537  04/30/15 0511  NA 138  < > 141  K 3.6  < > 3.7  CL 103  < > 111  CO2 26  < > 23  GLUCOSE 110*  < > 115*  BUN 9  < > 10  CREATININE 0.87  < > 0.77  CALCIUM 9.2  < > 8.4*  AST 23  --   --   ALT 27  --   --   ALKPHOS 93  --   --   BILITOT 1.2  --   --   < > = values in this interval not displayed. ------------------------------------------------------------------------------------------------------------------  Cardiac Enzymes No results for input(s): TROPONINI in the last 168 hours. ------------------------------------------------------------------------------------------------------------------  RADIOLOGY:  No results found.   ASSESSMENT AND PLAN:   54 year old male with EtOH and tobacco dependence status  post mechanical fall and subsequently has suffered a left leg cellulitis.  1. Left lower extremity cellulitis from a mechanical fall: Patient was seen and evaluated by surgery. No indication for incision and drainage at this time.  Patient will continue on vancomycin and Zosyn. We will continue to follow wound and blood cultures which are thus far pending.  I will order an  MRI for ongoing cellulitis in the fact that the cellulitis is spread. Further recommendations after MRI performed. Continue elevation of the leg.  2. EtOH dependence: Patient does not appear to be in withdrawal.  3. Tobacco dependence: Patient was counseled on admission. We encouraged the patient to quit smoking. 4. Sepsis: Patient meets sepsis criteria by leukocytosis and fever along with tachycardia. His sepsis secondary to problem #1. Continue management as indicated above.     Management plans discussed with the patient and he is in agreement.  CODE STATUS: Full  TOTAL TIME TAKING CARE OF THIS PATIENT: 25 minutes.     POSSIBLE D/C 2-3 days, DEPENDING ON CLINICAL CONDITION.   Aamiyah Derrick M.D on 04/30/2015 at 11:16 AM  Between 7am to 6pm - Pager - [phone] After 6pm go to [email or web address] - password [email or web address] [facility]  [facility]  Office  [phone]  CC: Primary care physician; No primary care provider on file.

## 2015-04-30 NOTE — Progress Notes (Signed)
Patient ID: Keith Johnston, male   DOB: [birth year], 54 y.o.   MRN: [id number]   I reviewed MRI scan.  Clinically the abscess is drained by exam earlier today  I will re-assess in am and make patient NPO after 4 am for possible formal I and D in main OR.

## 2015-04-30 NOTE — Progress Notes (Signed)
Patient ID: Keith Johnston, male   DOB: 12-Jun-1961, 54 y.o.   MRN: [id number]   Feels like leg more swollen  Filed Vitals:   04/29/15 1513 04/29/15 2000 04/30/15 0353 04/30/15 0722  BP: 120/66 126/67 131/75 138/71  Pulse: 84 85 75 81  Temp: 99.1 F (37.3 C) 99.6 F (37.6 C) 98.4 F (36.9 C) 98.6 F (37 C)  TempSrc: Oral Oral Oral Oral  Resp:  16  16  Height:      Weight:      SpO2: 100% 98% 95% 99%   Exam shows moderate cellulitis, substantial ankle edema.  I see no indication for MRI examination.  I was able to express some pus from superficial wound.  Small opening now over area of abscess.  IMP cont zosyn. Leg elevation. Will see in am.

## 2015-04-30 NOTE — Progress Notes (Signed)
Pam- here to take patient to MRI.

## 2015-05-01 ENCOUNTER — Encounter
Admission: EM | Disposition: A | Discharge status: Home / Self Care | Payer: Self-pay | Source: Home / Self Care | Attending: Internal Medicine

## 2015-05-01 ENCOUNTER — Inpatient Hospital Stay: Payer: Self-pay | Admitting: Anesthesiology

## 2015-05-01 ENCOUNTER — Inpatient Hospital Stay: Payer: MEDICAID | Admitting: Anesthesiology

## 2015-05-01 DIAGNOSIS — A419 Sepsis, unspecified organism: Secondary | ICD-10-CM | POA: Insufficient documentation

## 2015-05-01 DIAGNOSIS — L0291 Cutaneous abscess, unspecified: Secondary | ICD-10-CM

## 2015-05-01 HISTORY — PX: INCISION AND DRAINAGE ABSCESS: SHX5864

## 2015-05-01 LAB — BASIC METABOLIC PANEL
Anion gap: 8 (ref 5–15)
BUN: 8 mg/dL (ref 6–20)
CALCIUM: 8.6 mg/dL — AB (ref 8.9–10.3)
CO2: 23 mmol/L (ref 22–32)
CREATININE: 0.73 mg/dL (ref 0.61–1.24)
Chloride: 111 mmol/L (ref 101–111)
GFR calc non Af Amer: >60 mL/min (ref >60)
Glucose, Bld: 95 mg/dL (ref 65–99)
POTASSIUM: 3.8 mmol/L (ref 3.5–5.1)
SODIUM: 142 mmol/L (ref 135–145)

## 2015-05-01 LAB — CBC
HEMATOCRIT: 36.5 % — AB (ref 40.0–52.0)
Hemoglobin: 12.3 g/dL — ABNORMAL LOW (ref 13.0–18.0)
MCH: 31.1 pg (ref 26.0–34.0)
MCHC: 33.6 g/dL (ref 32.0–36.0)
MCV: 92.4 fL (ref 80.0–100.0)
PLATELETS: 323 10*3/uL (ref 150–440)
RBC: 3.95 MIL/uL — AB (ref 4.40–5.90)
RDW: 14 % (ref 11.5–14.5)
WBC: 10.2 10*3/uL (ref 3.8–10.6)

## 2015-05-01 LAB — SURGICAL PCR SCREEN
MRSA, PCR: NEGATIVE
STAPHYLOCOCCUS AUREUS: NEGATIVE

## 2015-05-01 SURGERY — INCISION AND DRAINAGE, ABSCESS
Anesthesia: General | Laterality: Left

## 2015-05-01 MED ORDER — ONDANSETRON HCL 4 MG/2ML IJ SOLN
INTRAMUSCULAR | Status: DC | PRN
  Administered 2015-05-01: 4 mg via INTRAVENOUS

## 2015-05-01 MED ORDER — FENTANYL CITRATE (PF) 100 MCG/2ML IJ SOLN
25.0000 ug | INTRAMUSCULAR | Status: DC | PRN
  Administered 2015-05-01 (×2): 25 ug via INTRAVENOUS

## 2015-05-01 MED ORDER — ONDANSETRON HCL 4 MG/2ML IJ SOLN: 4.0000 mg | Freq: Once | INTRAMUSCULAR | Status: AC | PRN

## 2015-05-01 MED ORDER — LACTATED RINGERS IV SOLN
INTRAVENOUS | Status: DC | PRN
  Administered 2015-05-01: 12:00:00 via INTRAVENOUS

## 2015-05-01 MED ORDER — MIDAZOLAM HCL 2 MG/2ML IJ SOLN
INTRAMUSCULAR | Status: DC | PRN
  Administered 2015-05-01: 2 mg via INTRAVENOUS

## 2015-05-01 MED ORDER — HYDROMORPHONE HCL 1 MG/ML IJ SOLN: 0.2500 mg | INTRAMUSCULAR | Status: DC | PRN

## 2015-05-01 MED ORDER — PROPOFOL 10 MG/ML IV BOLUS
INTRAVENOUS | Status: DC | PRN
  Administered 2015-05-01: 250 mg via INTRAVENOUS

## 2015-05-01 MED ORDER — LIDOCAINE HCL (CARDIAC) 20 MG/ML IV SOLN
INTRAVENOUS | Status: DC | PRN
  Administered 2015-05-01: 100 mg via INTRAVENOUS

## 2015-05-01 MED ORDER — FENTANYL CITRATE (PF) 100 MCG/2ML IJ SOLN
INTRAMUSCULAR | Status: DC | PRN
  Administered 2015-05-01 (×2): 50 ug via INTRAVENOUS

## 2015-05-01 SURGICAL SUPPLY — 18 items
BNDG COHESIVE 4X5 TAN STRL (GAUZE/BANDAGES/DRESSINGS) ×6 IMPLANT
BNDG GAUZE 4.5X4.1 6PLY STRL (MISCELLANEOUS) ×3 IMPLANT
CHLORAPREP W/TINT 26ML (MISCELLANEOUS) ×3 IMPLANT
DRAIN PENROSE 1/4X12 LTX (DRAIN) ×3 IMPLANT
GAUZE SPONGE 4X4 12PLY STRL (GAUZE/BANDAGES/DRESSINGS) ×3 IMPLANT
GLOVE BIO SURGEON STRL SZ8 (GLOVE) ×9 IMPLANT
GOWN STRL REUS W/ TWL LRG LVL3 (GOWN DISPOSABLE) ×2 IMPLANT
GOWN STRL REUS W/TWL LRG LVL3 (GOWN DISPOSABLE) ×4
KIT RM TURNOVER STRD PROC AR (KITS) ×3 IMPLANT
LABEL OR SOLS (LABEL) ×3 IMPLANT
NS IRRIG 500ML POUR BTL (IV SOLUTION) ×3 IMPLANT
PACK EXTREMITY ARMC (MISCELLANEOUS) ×3 IMPLANT
PAD ABD DERMACEA PRESS 5X9 (GAUZE/BANDAGES/DRESSINGS) ×6 IMPLANT
PAD GROUND ADULT SPLIT (MISCELLANEOUS) ×3 IMPLANT
SPONGE LAP 18X18 5 PK (GAUZE/BANDAGES/DRESSINGS) IMPLANT
STOCKINETTE STRL 4IN 9604848 (GAUZE/BANDAGES/DRESSINGS) ×3 IMPLANT
SWAB CULTURE AMIES ANAERIB BLU (MISCELLANEOUS) ×3 IMPLANT
SWAB DUAL CULTURE TRANS RED ST (MISCELLANEOUS) ×3 IMPLANT

## 2015-05-01 NOTE — Anesthesia Postprocedure Evaluation (Signed)
  Anesthesia Post-op Note  Patient: Keith Johnston  Procedure(s) Performed: Procedure(s): INCISION AND DRAINAGE ABSCESS (Left)  Anesthesia type:General LMA  Patient location: PACU  Post pain: Pain level controlled  Post assessment: Post-op Vital signs reviewed, Patient's Cardiovascular Status Stable, Respiratory Function Stable, Patent Airway and No signs of Nausea or vomiting  Post vital signs: Reviewed and stable  Last Vitals:  Filed Vitals:   05/01/15 2021  BP: 129/79  Pulse: 71  Temp: 36.8 C  Resp: 18    Level of consciousness: awake, alert  and patient cooperative  Complications: No apparent anesthesia complications  

## 2015-05-01 NOTE — Op Note (Signed)
04/28/2015 - 05/01/2015  12:09 PM  PATIENT:  Keith Johnston  54 y.o. male  PRE-OPERATIVE DIAGNOSIS:  left lower leg abscess  POST-OPERATIVE DIAGNOSIS:  same  PROCEDURE:  Procedure(s): INCISION AND DRAINAGE ABSCESS (Left) leg abscess.  SURGEON:  Surgeon(s) and Role:    * Natale Lay, MD - Primary  ASSISTANTS: none  ANESTHESIA: gen   SPECIMEN: pus   EBL: minimal.  Description of procedure:    With informed consent supine position general anesthesia with LMA airway the patient's left leg was sterilely prepped and draped with Betadine. Timeout was observed.   Examination of the wound expresses small amount of thick pus culture which was obtained and submitted for microbacterial analysis.  I placed a small hemostat clamp within the opening and this tracked both cephalad and caudad. Counter incisions were fashioned approximate 5 cm apart. The bronchoscope was irrigated with an Angiocath needle. A quarter-inch Penrose drain was placed 2. Patient's leg was then dressed with ABDs Kerlix followed by Coban. He was then taken to the recovery room in stable and satisfactory condition by anesthesia.

## 2015-05-01 NOTE — Progress Notes (Signed)
[facility] - [place] at [facility]   PATIENT NAME: Keith Johnston    MR#:  [id number]  DATE OF BIRTH:  [birth year]  SUBJECTIVE:  Patient going to OR for incision and drainage  REVIEW OF SYSTEMS:    Review of Systems  Constitutional: Negative for fever, chills and malaise/fatigue.  HENT: Negative for hearing loss and sore throat.   Eyes: Negative for blurred vision.  Respiratory: Negative for cough, hemoptysis, shortness of breath and wheezing.   Cardiovascular: Negative for chest pain, palpitations and leg swelling.  Gastrointestinal: Negative for nausea, vomiting, abdominal pain, diarrhea and blood in stool.  Genitourinary: Negative for dysuria.  Musculoskeletal: Negative for back pain.       Cellulitis improving drainage from abscess  Skin:       Left leg cellulitis  Neurological: Negative for dizziness, tremors and headaches.  Endo/Heme/Allergies: Does not bruise/bleed easily.    Tolerating Diet: Yes      DRUG ALLERGIES:  No Known Allergies  VITALS:  Blood pressure 138/89, pulse 79, temperature 98.4 F (36.9 C), temperature source Oral, resp. rate 16, height 6\' 6"  (1.981 m), weight 108.863 kg (240 lb), SpO2 100 %.  PHYSICAL EXAMINATION:   Physical Exam  Constitutional: He is oriented to person, place, and time and well-developed, well-nourished, and in no distress. No distress.  HENT:  Head: Normocephalic.  Eyes: No scleral icterus.  Neck: Normal range of motion. Neck supple. No JVD present. No tracheal deviation present.  Cardiovascular: Normal rate, regular rhythm and normal heart sounds.  Exam reveals no gallop and no friction rub.   No murmur heard. Pulmonary/Chest: Effort normal and breath sounds normal. No respiratory distress. He has no wheezes. He has no rales. He exhibits no tenderness.  Abdominal: Soft. Bowel sounds are normal. He exhibits no distension and no mass. There is no tenderness. There is no rebound and no guarding.   Musculoskeletal: Normal range of motion. He exhibits no edema.  Neurological: He is alert and oriented to person, place, and time.  Skin: Skin is warm. No rash noted. There is erythema.  Left lower extremity was just dressed by nursing staff as patient is going to the OR. Patient has cellulitis of his left lower extremity streaking to his feet and upper thigh.  Psychiatric: Affect and judgment normal.      LABORATORY PANEL:   CBC  Recent Labs Lab 05/01/15 0427  WBC 10.2  HGB 12.3*  HCT 36.5*  PLT 323   ------------------------------------------------------------------------------------------------------------------  Chemistries   Recent Labs Lab 04/28/15 1537  05/01/15 0427  NA 138  < > 142  K 3.6  < > 3.8  CL 103  < > 111  CO2 26  < > 23  GLUCOSE 110*  < > 95  BUN 9  < > 8  CREATININE 0.87  < > 0.73  CALCIUM 9.2  < > 8.6*  AST 23  --   --   ALT 27  --   --   ALKPHOS 93  --   --   BILITOT 1.2  --   --   < > = values in this interval not displayed. ------------------------------------------------------------------------------------------------------------------  Cardiac Enzymes No results for input(s): TROPONINI in the last 168 hours. ------------------------------------------------------------------------------------------------------------------  RADIOLOGY:  Mr Tibia Fibula Left W Wo Contrast  04/30/2015   CLINICAL DATA:  Fall. Leg pain. Swelling and redness of the lower 1/3 of the LEFT leg. Osteomyelitis.  EXAM: MRI OF LOWER LEFT EXTREMITY WITHOUT AND WITH CONTRAST  TECHNIQUE: Multiplanar, multisequence MR imaging of the LEFT leg was performed both before and after administration of intravenous contrast.  CONTRAST:  20mL MULTIHANCE GADOBENATE DIMEGLUMINE 529 MG/ML IV SOLN  COMPARISON:  None.  FINDINGS: There appears to be ulceration in the pretibial soft tissues with nonenhancing fluid dissecting along the proximal tibial shaft. This is best seen in on the post  gadolinium images (image 12 series 15). This is suspicious for a thin elongated abscess. Craniocaudal this area measures 12 cm. This is thin in the AP dimension, measuring 7 mm. Transversely this measures about 4 cm.  Diffuse edema is present in the LEFT leg. This involves the subcutaneous tissues with fluid tracking along the fascia layers in the posterior compartment. No osteomyelitis. Inflammatory findings are unilateral. Incidental visualization of the RIGHT leg is normal.  IMPRESSION: 1. Thin pretibial abscess of the LEFT leg which may communicate with the ulceration in the pretibial soft tissues producing a sinus tract. 2. Negative for osteomyelitis. 3. Diffuse edema of the LEFT leg compatible with cellulitis.   Electronically Signed   By: Andreas Newport M.D.   On: 04/30/2015 14:02     ASSESSMENT AND PLAN:   54 year old male with EtOH and tobacco dependence status post mechanical fall and subsequently has suffered a left leg cellulitis.  1. Left lower extremity cellulitis from a mechanical fall: MRI is concerning for an abscess. Surgery attempted to drain abscess at bedside yesterday however it was ineffective. Patient is going to the operating room for further I&D. Continue broad-spectrum antibiotics including Zosyn and vancomycin. His wound culture is growing moderate growth Streptococcus group F. Blood cultures are negative to date.  2. EtOH dependence: Patient does not appear to be in withdrawal.  3. Tobacco dependence: Patient was counseled on admission. We encouraged the patient to quit smoking. 4. Sepsis: Patient meets sepsis criteria by leukocytosis and fever along with tachycardia. His sepsis secondary to problem #1. Continue management as indicated above.     Management plans discussed with the patient and he is in agreement. Plan discussed with Dr. Colette Ribas CODE STATUS: Full  TOTAL TIME TAKING CARE OF THIS PATIENT: 25 minutes.     POSSIBLE D/C 2-3 days, DEPENDING ON CLINICAL  CONDITION.   Dail Lerew M.D on 05/01/2015 at 11:13 AM  Between 7am to 6pm - Pager - [phone] After 6pm go to [email or web address] - password [email or web address] [facility]  [facility]  Office  [phone]  CC: Primary care physician; No primary care provider on file.

## 2015-05-01 NOTE — Progress Notes (Signed)
Patient ID: Keith Johnston, male   DOB: 27-Dec-1960, 54 y.o.   MRN: [id number]   Feels the same,  Unable to place left leg on ground due to terrible pain.  Filed Vitals:   04/30/15 1525 04/30/15 2032 05/01/15 0504 05/01/15 0712  BP: 151/76 145/79 133/82 138/89  Pulse: 79 79 69 79  Temp: 98.2 F (36.8 C) 98.5 F (36.9 C) 98.5 F (36.9 C) 98.4 F (36.9 C)  TempSrc: Oral Oral Oral Oral  Resp: Height:      Weight:      SpO2: 97% 99% 99% 100%    Review of Systems  Constitutional: Negative for fever and chills.  Gastrointestinal: Negative for nausea, vomiting and abdominal pain.  Psychiatric/Behavioral: Negative.   All other systems reviewed and are negative.   Physical examination demonstrates a white male in no obvious distress. Affect normal. Facies are symmetrical. Left lower extremity looks to be more cellulitic and it was yesterday. Although the edema around the medial malleolus is diminished. The small I&D site has closed from yesterday.  View of MRI scan performed yesterday demonstrates a thin linear abscess along the pretibial space.   IMP  Left leg abscess  Plan I and D in OR.

## 2015-05-01 NOTE — Progress Notes (Signed)
ANTIBIOTIC CONSULT NOTE -FOLLOW UP   Pharmacy Consult for Vancomycin and Zosyn Indication: Cellulitis/Abscess  No Known Allergies  Patient Measurements: Height:  (198.1 cm) Weight: 240 lb (108.863 kg) IBW/kg (Calculated) : 91.4 Adjusted Body Weight: 98.4 kg  Vital Signs: Temp: 98.4 F (36.9 C) (08/06 0712) Temp Source: Oral (08/06 0712) BP: 138/89 mmHg (08/06 0712) Pulse Rate: 79 (08/06 0712)  Labs:  Recent Labs  04/29/15 0450 04/30/15 0511 05/01/15 0427  WBC 14.7* 10.5 10.2  HGB 12.8* 12.2* 12.3*  PLT 252 267 323  CREATININE 0.75 0.77 0.73   Estimated Creatinine Clearance: 139.6 mL/min (by C-G formula based on Cr of 0.73).  Recent Labs  04/30/15 0915  [facility] 18     Microbiology: Recent Results (from the past 720 hour(s))  Blood culture (routine x 2)     Status: None (Preliminary result)   Collection Time: 04/28/15  3:37 PM  Result Value Ref Range Status   Specimen Description BLOOD LEFT ASSIST CONTROL  Final   Special Requests BOTTLES DRAWN AEROBIC AND ANAEROBIC  Final   Culture NO GROWTH 3 DAYS  Final   Report Status PENDING  Incomplete  Blood culture (routine x 2)     Status: None (Preliminary result)   Collection Time: 04/28/15  3:37 PM  Result Value Ref Range Status   Specimen Description BLOOD RIGHT ASSIST CONTROL  Final   Special Requests BOTTLES DRAWN AEROBIC AND ANAEROBIC  Final   Culture NO GROWTH 3 DAYS  Final   Report Status PENDING  Incomplete  Wound culture     Status: None (Preliminary result)   Collection Time: 04/28/15  3:37 PM  Result Value Ref Range Status   Specimen Description ABSCESS  Final   Special Requests NONE  Final   Gram Stain   Final    MODERATE WBC SEEN MODERATE GRAM NEGATIVE RODS FEW GRAM POSITIVE COCCI    Culture   Final    MODERATE GROWTH STREPTOCOCCUS GROUP F There is no known Penicillin Resistant Beta Streptococcus in the [place] For patients that are Penicillin-allergic, Erythromycin is 85-94%  susceptible, and Clindamycin is 80% susceptible.  Contact Microbiology within 7 days if sensitivity testing is  required.      Report Status PENDING  Incomplete  Surgical pcr screen     Status: None   Collection Time: 05/01/15  8:11 AM  Result Value Ref Range Status   MRSA, PCR NEGATIVE NEGATIVE Final   Staphylococcus aureus NEGATIVE NEGATIVE Final    Comment:        The Xpert SA Assay (FDA approved for NASAL specimens in patients over 62 years of age), is one component of a comprehensive surveillance program.  Test performance has been validated by [facility] for patients greater than or equal to 71 year old. It is not intended to diagnose infection nor to guide or monitor treatment.      Assessment: Pharmacy consulted to dose vancomycin/zosyn for cellulitis/abscess in this 54 year old male. Patient initially on clindamycin developed fever/tachycardia so spectrum was broadened to vanco/Zosyn.  Wound cultures with GPC and GNR, patient to OR for I&D of abscess today.   Goal of Therapy:  Vancomycin trough level 15-20 mcg/ml  Plan:  Trough therapeutic 8/5 at 40mcg/ml. Will continue vancomycin 1.5gm IV Q8H EI and recheck trough on 8/7 as patient at risk for drug accumulation due to obesity.   Continue Zosyn 3.375 gm IV Q8H  Mariely Mahr C 05/01/2015,11:11 AM

## 2015-05-01 NOTE — Anesthesia Preprocedure Evaluation (Signed)
Anesthesia Evaluation  Patient identified by MRN, date of birth, ID band Patient awake    Reviewed: Allergy & Precautions, H&P , NPO status , Patient's Chart, lab work & pertinent test results, reviewed documented beta blocker date and time   Airway Mallampati: II  TM Distance: >3 FB Neck ROM: full    Dental   Pulmonary Current Smoker,          Cardiovascular Normal cardiovascular examRate:Normal     Neuro/Psych    GI/Hepatic   Endo/Other    Renal/GU      Musculoskeletal   Abdominal   Peds  Hematology   Anesthesia Other Findings   Reproductive/Obstetrics                             Anesthesia Physical Anesthesia Plan  ASA: II and emergent  Anesthesia Plan: General LMA   Post-op Pain Management:    Induction:   Airway Management Planned:   Additional Equipment:   Intra-op Plan:   Post-operative Plan:   Informed Consent: I have reviewed the patients History and Physical, chart, labs and discussed the procedure including the risks, benefits and alternatives for the proposed anesthesia with the patient or authorized representative who has indicated his/her understanding and acceptance.     Plan Discussed with: CRNA  Anesthesia Plan Comments:         Anesthesia Quick Evaluation

## 2015-05-01 NOTE — Anesthesia Procedure Notes (Signed)
Procedure Name: LMA Insertion Date/Time: 05/01/2015 11:48 AM Performed by: ZOXWRUE, Jeanni Allshouse Pre-anesthesia Checklist: Patient identified, Timeout performed, Patient being monitored, Suction available and Emergency Drugs available Patient Re-evaluated:Patient Re-evaluated prior to inductionOxygen Delivery Method: Circle system utilized Preoxygenation: Pre-oxygenation with 100% oxygen Intubation Type: IV induction LMA: LMA inserted LMA Size: 5.0 Tube type: Oral

## 2015-05-01 NOTE — Anesthesia Postprocedure Evaluation (Signed)
  Anesthesia Post-op Note  Patient: Keith Johnston  Procedure(s) Performed: Procedure(s): INCISION AND DRAINAGE ABSCESS (Left)  Anesthesia type:General LMA  Patient location: PACU  Post pain: Pain level controlled  Post assessment: Post-op Vital signs reviewed, Patient's Cardiovascular Status Stable, Respiratory Function Stable, Patent Airway and No signs of Nausea or vomiting  Post vital signs: Reviewed and stable  Last Vitals:  Filed Vitals:   05/01/15 2021  BP: 129/79  Pulse: 71  Temp: 36.8 C  Resp: 18    Level of consciousness: awake, alert  and patient cooperative  Complications: No apparent anesthesia complications

## 2015-05-01 NOTE — Transfer of Care (Signed)
Immediate Anesthesia Transfer of Care Note  Patient: Keith Johnston  Procedure(s) Performed: Procedure(s): INCISION AND DRAINAGE ABSCESS (Left)  Patient Location: PACU  Anesthesia Type:General  Level of Consciousness: awake, alert  and oriented  Airway & Oxygen Therapy: Patient Spontanous Breathing  Post-op Assessment: Report given to RN  Post vital signs: stable  Last Vitals:  Filed Vitals:   05/01/15 1210  BP: 138/84  Pulse: 75  Temp: 36.6 C  Resp: 16    Complications: No apparent anesthesia complications

## 2015-05-02 LAB — BASIC METABOLIC PANEL
ANION GAP: 8 (ref 5–15)
BUN: 11 mg/dL (ref 6–20)
CO2: 25 mmol/L (ref 22–32)
Calcium: 8.7 mg/dL — ABNORMAL LOW (ref 8.9–10.3)
Chloride: 108 mmol/L (ref 101–111)
Creatinine, Ser: 0.78 mg/dL (ref 0.61–1.24)
Glucose, Bld: 114 mg/dL — ABNORMAL HIGH (ref 65–99)
Potassium: 3.6 mmol/L (ref 3.5–5.1)
Sodium: 141 mmol/L (ref 135–145)

## 2015-05-02 LAB — CBC
HEMATOCRIT: 36.5 % — AB (ref 40.0–52.0)
HEMOGLOBIN: 12.5 g/dL — AB (ref 13.0–18.0)
MCH: 31.1 pg (ref 26.0–34.0)
MCHC: 34.3 g/dL (ref 32.0–36.0)
MCV: 90.7 fL (ref 80.0–100.0)
Platelets: 352 10*3/uL (ref 150–440)
RBC: 4.03 MIL/uL — AB (ref 4.40–5.90)
RDW: 13.9 % (ref 11.5–14.5)
WBC: 8.5 10*3/uL (ref 3.8–10.6)

## 2015-05-02 MED ORDER — AMOXICILLIN-POT CLAVULANATE 875-125 MG PO TABS: 1.0000 | ORAL_TABLET | Freq: Two times a day (BID) | ORAL | Status: DC

## 2015-05-02 NOTE — Progress Notes (Signed)
Keith Johnston was admitted to the Hospital on 04/28/2015 and Discharged  05/02/2015 and should be excused from work/school   For 9 days starting 04/28/2015 , may return to work/school without any restrictions.  Call Keith Saran MD with questions.  [name] M.D on 05/02/2015,at 11:02 AM  [facility] -  at [facility]  [phone]

## 2015-05-02 NOTE — Progress Notes (Signed)
Patient ID: Keith Johnston, male   DOB: [birth year], 54 y.o.   MRN: [id number]   POD 1 s/p left leg I and D  No fevers  PE:  muchless induration and cellulitis Minimal purulent drainage noted Less ankle edema  Stable and improved for discharge  F/u Wed in my [place] office for drain removal.  D/w Dr Juliene Pina.

## 2015-05-02 NOTE — Discharge Summary (Signed)
[name] NAME: Keith Johnston    MR#:  [id number]  DATE OF BIRTH:  19-Aug-1961  DATE OF ADMISSION:  04/28/2015 ADMITTING PHYSICIAN: Bettey Costa, MD  DATE OF DISCHARGE: 05/02/2015 PRIMARY CARE PHYSICIAN: No primary care provider on file.    ADMISSION DIAGNOSIS:  Fall; L Leg Pain left lower leg abscess  DISCHARGE DIAGNOSIS:  Active Problems:   Cellulitis   Cellulitis of left lower extremity   Sepsis affecting skin   SECONDARY DIAGNOSIS:  History reviewed. No pertinent past medical history.  HOSPITAL COURSE:   54 year old male with EtOH and tobacco dependence status post mechanical fall and subsequently has suffered a left leg cellulitis.  1. Left lower extremity cellulitis from a mechanical fall: He was placed on broad-spectrum antibiotics including vancomycin and Zosyn. Patient underwent I and D on 05/01/2015. He currently has dressing. Cellulitis is much improved after the I&D. Patient's wound culture was positive for Streptococcus F. Patient will be discharged on Augmentin. Patient will see Dr. Felton Clinton on August 10.   2. EtOH dependence: Patient had uneventful detox. 3. Tobacco dependence: Patient was counseled on admission. We encouraged the patient to quit smoking. 4. Sepsis: Patient met sepsis criteria by leukocytosis and fever along with tachycardia. His sepsis secondary to problem #1. This is improved  DISCHARGE CONDITIONS AND DIET:  Patient is being discharged in stable condition on a regular diet  CONSULTS OBTAINED:  Treatment Team:  Sherri Rad, MD  DRUG ALLERGIES:  No Known Allergies  DISCHARGE MEDICATIONS:   Current Discharge Medication List    START taking these medications   Details  amoxicillin-clavulanate (AUGMENTIN) 875-125 MG per tablet Take 1 tablet by mouth 2 (two) times daily. Qty: 20 tablet, Refills: 0              Today   CHIEF COMPLAINT:  Patient is doing well this morning. He  has no complaints.   VITAL SIGNS:  Blood pressure 140/86, pulse 74, temperature 98.3 F (36.8 C), temperature source Oral, resp. rate 18, height $RemoveBe'6\' 6"'CBjjNHZcS$  (1.981 m), weight 108.863 kg (240 lb), SpO2 99 %.   REVIEW OF SYSTEMS:  Review of Systems  Constitutional: Negative for fever, chills and malaise/fatigue.  HENT: Negative for sore throat.   Eyes: Negative for blurred vision.  Respiratory: Negative for cough, hemoptysis, shortness of breath and wheezing.   Cardiovascular: Negative for chest pain, palpitations and leg swelling.  Gastrointestinal: Negative for nausea, vomiting, abdominal pain, diarrhea and blood in stool.  Genitourinary: Negative for dysuria.  Musculoskeletal: Negative for back pain.  Skin:       Cellulitis improved after i and d incision is packed and having some drainage  Neurological: Negative for dizziness, tremors and headaches.  Endo/Heme/Allergies: Does not bruise/bleed easily.     PHYSICAL EXAMINATION:  GENERAL:  54 y.o.-year-old patient lying in the bed with no acute distress.  NECK:  Supple, no jugular venous distention. No thyroid enlargement, no tenderness.  LUNGS: Normal breath sounds bilaterally, no wheezing, rales,rhonchi  No use of accessory muscles of respiration.  CARDIOVASCULAR: S1, S2 normal. No murmurs, rubs, or gallops.  ABDOMEN: Soft, non-tender, non-distended. Bowel sounds present. No organomegaly or mass.  EXTREMITIES: No pedal edema, cyanosis, or clubbing.  PSYCHIATRIC: The patient is alert and oriented x 3.  SKIN: Cellulitis is much improved. His incision is packed. There is minimal drainage.  DATA REVIEW:   CBC  Recent Labs Lab 05/02/15 0410  WBC 8.5  HGB 12.5*  HCT 36.5*  PLT 352    Chemistries   Recent Labs Lab 04/28/15 1537  05/02/15 0410  NA 138  < > 141  K 3.6  < > 3.6  CL 103  < > 108  CO2 26  < > 25  GLUCOSE 110*  < > 114*  BUN 9  < > 11  CREATININE 0.87  < > 0.78  CALCIUM 9.2  < > 8.7*  AST 23  --   --    ALT 27  --   --   ALKPHOS 93  --   --   BILITOT 1.2  --   --   < > = values in this interval not displayed.  Cardiac Enzymes No results for input(s): TROPONINI in the last 168 hours.  Microbiology Results  $Remove'@MICRORSLT48'bsFUJOE$ @  RADIOLOGY:  Mr Tibia Fibula Left W Wo Contrast  04/30/2015     IMPRESSION: 1. Thin pretibial abscess of the LEFT leg which may communicate with the ulceration in the pretibial soft tissues producing a sinus tract. 2. Negative for osteomyelitis. 3. Diffuse edema of the LEFT leg compatible with cellulitis.   Electronically Signed   By: Dereck Ligas M.D.   On: 04/30/2015 14:02      Management plans discussed with the patient and he is in agreement. Stable for discharge home  Patient should follow up with Dr. Marina Gravel 8/10 for drain removal  CODE STATUS:     Code Status Orders        Start     Ordered   04/28/15 1852  Full code   Continuous     04/28/15 1851      TOTAL TIME TAKING CARE OF THIS PATIENT: 35 minutes.    Tilford Deaton M.D on 05/02/2015 at 9:29 AM  Between 7am to 6pm - Pager - [phone] After 6pm go to [email or web address] - password [email or web address] [facility]  [facility]  Office  [phone]  CC: Primary care physician; No primary care provider on file.

## 2015-05-03 ENCOUNTER — Telehealth: Payer: Self-pay | Admitting: Surgery

## 2015-05-03 ENCOUNTER — Encounter: Payer: Self-pay | Admitting: Surgery

## 2015-05-03 LAB — CULTURE, BLOOD (ROUTINE X 2)
CULTURE: NO GROWTH
Culture: NO GROWTH

## 2015-05-03 NOTE — Telephone Encounter (Signed)
Called patient back at this time. Explained that he needs to keep a clean, dry dressing over the area and throw out the drain that has fallen out already but to leave the other drain in place.  Pt denies fever, nausea, or vomiting at this time.  Will see patient on Wednesday at follow-up appointment.

## 2015-05-03 NOTE — Telephone Encounter (Signed)
Patient has a post op/follow up appointment Wednesday but said a drain already came out. Is that ok? Please call

## 2015-05-05 ENCOUNTER — Ambulatory Visit (INDEPENDENT_AMBULATORY_CARE_PROVIDER_SITE_OTHER): Payer: Self-pay | Admitting: Surgery

## 2015-05-05 ENCOUNTER — Encounter: Payer: Self-pay | Admitting: Surgery

## 2015-05-05 VITALS — BP 144/83 | HR 103 | Temp 98.3°F | Ht 78.0 in | Wt 236.0 lb

## 2015-05-05 DIAGNOSIS — L0291 Cutaneous abscess, unspecified: Secondary | ICD-10-CM

## 2015-05-05 DIAGNOSIS — L03116 Cellulitis of left lower limb: Secondary | ICD-10-CM

## 2015-05-05 DIAGNOSIS — A419 Sepsis, unspecified organism: Secondary | ICD-10-CM

## 2015-05-05 LAB — WOUND CULTURE

## 2015-05-05 NOTE — Patient Instructions (Signed)
Please give [place] a call if you have any questions or concerns. Finish your antibiotics and we will see you next week.

## 2015-05-05 NOTE — Progress Notes (Signed)
Surgery clinic    He is status post incision and drainage of left leg last week. He is doing well. He is having much less pain and drainage. One Penrose drain has become dislodged.   Clinical examination demonstrates minimal edema. Remaining Penrose drain was removed. Cavity was irrigated with saline. I cannot appreciate any undrained purulence.   Impression status post incision and drainage of left leg abscess.   Plan continue antibiotics follow-up in one week's time. Dry dressing as needed. Patient may shower.

## 2015-05-06 LAB — ANAEROBIC CULTURE

## 2015-05-06 LAB — WOUND CULTURE

## 2015-05-14 ENCOUNTER — Ambulatory Visit (INDEPENDENT_AMBULATORY_CARE_PROVIDER_SITE_OTHER): Payer: Self-pay | Admitting: Surgery

## 2015-05-14 ENCOUNTER — Encounter: Payer: Self-pay | Admitting: Surgery

## 2015-05-14 VITALS — BP 116/83 | HR 106 | Ht 78.0 in | Wt 232.0 lb

## 2015-05-14 DIAGNOSIS — L02419 Cutaneous abscess of limb, unspecified: Secondary | ICD-10-CM

## 2015-05-14 DIAGNOSIS — L03119 Cellulitis of unspecified part of limb: Secondary | ICD-10-CM

## 2015-05-14 NOTE — Progress Notes (Signed)
Outpatient Surgical Follow Up  05/14/2015  Keith Johnston is an 54 y.o. male.   Chief Complaint  Patient presents with  . Follow up I & D Left leg abscess    HPI: He had incision and drainage last week of a large traumatic abscess in his left lower leg. He fell onto concrete block developed a huge hematoma which turned into an abscess. It was incised and drained in the OR without any significant problem and he has no complaints at this time.  History reviewed. No pertinent past medical history.  Past Surgical History  Procedure Laterality Date  . Incision and drainage abscess Left 05/01/2015    Procedure: INCISION AND DRAINAGE ABSCESS;  Surgeon: Natale Lay, MD;  Location: [facility] ORS;  Service: General;  Laterality: Left;    History reviewed. No pertinent family history.  Social History:  reports that he has been smoking Cigarettes.  He has been smoking about 1.00 pack per day. He has never used smokeless tobacco. He reports that he drinks about 7.2 oz of alcohol per week. He reports that he does not use illicit drugs.  Allergies: No Known Allergies  Medications reviewed.    ROS    BP 116/83 mmHg  Pulse 106  Ht  (1.981 m)  Wt 232 lb (105.235 kg)  BMI 26.82 kg/m2  Physical Exam the leg looks good. There is no sign of any active infection. He has no cellulitis or erythema. He has no significant swelling in the calf.     No results found for this or any previous visit (from the past 48 hour(s)). No results found.  Assessment/Plan:  1. Cellulitis and abscess of leg, except foot He has improved dramatically. At the present time I do not see any indication for further intervention. We will see him back as necessary.     Tiney Rouge III  05/14/2015,negative

## 2015-05-14 NOTE — Patient Instructions (Signed)
Please call our office with any questions or concerns. 

## 2020-05-18 ENCOUNTER — Emergency Department (HOSPITAL_COMMUNITY)
Admission: EM | Admit: 2020-05-18 | Discharge: 2020-05-18 | Disposition: A | Discharge status: Home / Self Care | Payer: Self-pay | Attending: Emergency Medicine | Admitting: Emergency Medicine

## 2020-05-18 ENCOUNTER — Encounter (HOSPITAL_COMMUNITY): Payer: Self-pay | Admitting: *Deleted

## 2020-05-18 ENCOUNTER — Emergency Department (HOSPITAL_COMMUNITY): Payer: Self-pay

## 2020-05-18 ENCOUNTER — Other Ambulatory Visit: Payer: Self-pay

## 2020-05-18 DIAGNOSIS — R079 Chest pain, unspecified: Secondary | ICD-10-CM

## 2020-05-18 DIAGNOSIS — R0602 Shortness of breath: Secondary | ICD-10-CM | POA: Insufficient documentation

## 2020-05-18 DIAGNOSIS — R0789 Other chest pain: Secondary | ICD-10-CM | POA: Insufficient documentation

## 2020-05-18 DIAGNOSIS — F17210 Nicotine dependence, cigarettes, uncomplicated: Secondary | ICD-10-CM | POA: Insufficient documentation

## 2020-05-18 DIAGNOSIS — R42 Dizziness and giddiness: Secondary | ICD-10-CM | POA: Insufficient documentation

## 2020-05-18 LAB — BASIC METABOLIC PANEL
Anion gap: 12 (ref 5–15)
BUN: 7 mg/dL (ref 6–20)
CO2: 21 mmol/L — ABNORMAL LOW (ref 22–32)
Calcium: 8.9 mg/dL (ref 8.9–10.3)
Chloride: 104 mmol/L (ref 98–111)
Creatinine, Ser: 0.91 mg/dL (ref 0.61–1.24)
GFR calc Af Amer: >60 mL/min (ref >60)
GFR calc non Af Amer: >60 mL/min (ref >60)
Glucose, Bld: 128 mg/dL — ABNORMAL HIGH (ref 70–99)
Potassium: 3.5 mmol/L (ref 3.5–5.1)
Sodium: 137 mmol/L (ref 135–145)

## 2020-05-18 LAB — CBC
HCT: 43.1 % (ref 39.0–52.0)
Hemoglobin: 14.1 g/dL (ref 13.0–17.0)
MCH: 29.7 pg (ref 26.0–34.0)
MCHC: 32.7 g/dL (ref 30.0–36.0)
MCV: 90.7 fL (ref 80.0–100.0)
Platelets: 212 10*3/uL (ref 150–400)
RBC: 4.75 MIL/uL (ref 4.22–5.81)
RDW: 12.9 % (ref 11.5–15.5)
WBC: 3.3 10*3/uL — ABNORMAL LOW (ref 4.0–10.5)
nRBC: 0 % (ref 0.0–0.2)

## 2020-05-18 LAB — TROPONIN I (HIGH SENSITIVITY)
Troponin I (High Sensitivity): 7 ng/L (ref <18)
Troponin I (High Sensitivity): 6 ng/L (ref <18)

## 2020-05-18 NOTE — ED Provider Notes (Signed)
[place] EMERGENCY DEPARTMENT Provider Note  CSN: [id number] Arrival date & time: 05/18/20 5102    History Chief Complaint  Patient presents with  . Chest Pain  . Dizziness    HPI  Keith Johnston is a 59 y.o. male with no significant PMH reports he was at work this morning around [place] when he had sudden onset of a squeezing pain in his chest, associated with SOB and diaphoresis. He sat down on the floor at work, felt a little dizzy, but did not pass out. EMS was called, he was given ASA and NTG without much improvement. While waiting in the ED for an exam room, his symptoms have gradually improved. Denies HTN, DM, HLD or strong family history of CAD. No longer smokes. Does not take any medications.    History reviewed. No pertinent past medical history.  Past Surgical History:  Procedure Laterality Date  . INCISION AND DRAINAGE ABSCESS Left 05/01/2015   Procedure: INCISION AND DRAINAGE ABSCESS;  Surgeon: Natale Lay, MD;  Location: [facility] ORS;  Service: General;  Laterality: Left;    No family history on file.  Social History   Tobacco Use  . Smoking status: Current Every Day Smoker    Packs/day: 1.00    Types: Cigarettes  . Smokeless tobacco: Never Used  Substance Use Topics  . Alcohol use: Yes    Alcohol/week: 12.0 standard drinks    Types: 12 Cans of beer per week  . Drug use: No     Home Medications Prior to Admission medications   Not on File     Allergies    Patient has no known allergies.   Review of Systems   Review of Systems A comprehensive review of systems was completed and negative except as noted in HPI.    Physical Exam BP 140/82   Pulse 79   Temp 99 F (37.2 C) (Oral)   Resp 19   Ht 6\' 6"  (1.981 m)   Wt 108.9 kg   SpO2 98%   BMI 27.73 kg/m   Physical Exam Vitals and nursing note reviewed.  Constitutional:      Appearance: Normal appearance.  HENT:     Head: Normocephalic and atraumatic.     Nose: Nose normal.     Mouth/Throat:      Mouth: Mucous membranes are moist.  Eyes:     Extraocular Movements: Extraocular movements intact.     Conjunctiva/sclera: Conjunctivae normal.  Cardiovascular:     Rate and Rhythm: Normal rate.  Pulmonary:     Effort: Pulmonary effort is normal.     Breath sounds: Normal breath sounds.  Abdominal:     General: Abdomen is flat.     Palpations: Abdomen is soft.     Tenderness: There is no abdominal tenderness.  Musculoskeletal:        General: No swelling. Normal range of motion.     Cervical back: Neck supple.  Skin:    General: Skin is warm and dry.  Neurological:     General: No focal deficit present.     Mental Status: He is alert.  Psychiatric:        Mood and Affect: Mood normal.      ED Results / Procedures / Treatments   Labs (all labs ordered are listed, but only abnormal results are displayed) Labs Reviewed  BASIC METABOLIC PANEL - Abnormal; Notable for the following components:      Result Value   CO2 21 (*)  Glucose, Bld 128 (*)    All other components within normal limits  CBC - Abnormal; Notable for the following components:   WBC 3.3 (*)    All other components within normal limits  TROPONIN I (HIGH SENSITIVITY)  TROPONIN I (HIGH SENSITIVITY)    EKG EKG Interpretation  Date/Time:  Tuesday May 18 2020 16:53:47 EDT Ventricular Rate:  80 PR Interval:  136 QRS Duration: 107 QT Interval:  403 QTC Calculation: 465 R Axis:   76 Text Interpretation: Sinus rhythm Short PR interval Since last tracing Rate slower Confirmed by Susy Frizzle [phone]) on 05/18/2020 5:02:30 PM   Radiology DG Chest 2 View  Result Date: 05/18/2020 CLINICAL DATA:  Chest pain and dizziness EXAM: CHEST - 2 VIEW COMPARISON:  None. FINDINGS: Lungs are clear. Heart size and pulmonary vascularity are normal. No adenopathy. No pneumothorax. There is mild degenerative change in the thoracic spine. IMPRESSION: Lungs clear.  Cardiac silhouette within normal limits.  Electronically Signed   By: Bretta Bang III M.D.   On: 05/18/2020 07:49    Procedures Procedures  Medications Ordered in the ED Medications - No data to display   MDM Rules/Calculators/A&P MDM Patient's initial EKG from arrival reviewed, no ischemic changes. First trop on arrival is negative a second has not yet been drawn. CBC, BMP and CXR also unremarkable. He is low risk for CAD, HEART Pathway is 3. Will recheck EKG and second Trop.  ED Course  I have reviewed the triage vital signs and the nursing notes.  Pertinent labs & imaging results that were available during my care of the patient were reviewed by me and considered in my medical decision making (see chart for details).  Clinical Course as of May 18 1857  Tue May 18, 2020  2229 Patient's second troponin and second EKG remain normal. Low risk for ACS/CAD. Patient will be discharged home. Recommend PCP followup for recheck. Advised to RTED for worsening pain or any other concerns.   [CS]    Clinical Course User Index [CS] Pollyann Savoy, MD    Final Clinical Impression(s) / ED Diagnoses Final diagnoses:  Chest pain, unspecified type    Rx / DC Orders ED Discharge Orders    None       Pollyann Savoy, MD 05/18/20 1858

## 2020-05-18 NOTE — ED Triage Notes (Signed)
To ED via [facility] for eval of cp and dizziness. Pt states he was at work, coming out of the bathroom when he started having tightness around his upper abd. States he felt SOB due to tightness and feels 'hot as hell'. Nausea enroute but resolved since in ED. Skin w/d, Resp e/u. States he didn't sleep well last night. Also complains of dizziness that started at same time as cp- has not become any worse or any better.

## 2020-05-27 ENCOUNTER — Emergency Department: Payer: HRSA Program

## 2020-05-27 ENCOUNTER — Other Ambulatory Visit: Payer: Self-pay

## 2020-05-27 ENCOUNTER — Encounter: Payer: Self-pay | Admitting: *Deleted

## 2020-05-27 DIAGNOSIS — R000 Tachycardia, unspecified: Secondary | ICD-10-CM | POA: Diagnosis present

## 2020-05-27 DIAGNOSIS — U071 COVID-19: Secondary | ICD-10-CM | POA: Diagnosis not present

## 2020-05-27 DIAGNOSIS — K5900 Constipation, unspecified: Secondary | ICD-10-CM | POA: Diagnosis present

## 2020-05-27 DIAGNOSIS — L0390 Cellulitis, unspecified: Secondary | ICD-10-CM | POA: Diagnosis present

## 2020-05-27 DIAGNOSIS — F17210 Nicotine dependence, cigarettes, uncomplicated: Secondary | ICD-10-CM | POA: Diagnosis present

## 2020-05-27 DIAGNOSIS — I10 Essential (primary) hypertension: Secondary | ICD-10-CM | POA: Diagnosis present

## 2020-05-27 DIAGNOSIS — I2699 Other pulmonary embolism without acute cor pulmonale: Secondary | ICD-10-CM | POA: Diagnosis present

## 2020-05-27 DIAGNOSIS — J1282 Pneumonia due to coronavirus disease 2019: Secondary | ICD-10-CM | POA: Diagnosis present

## 2020-05-27 DIAGNOSIS — R911 Solitary pulmonary nodule: Secondary | ICD-10-CM | POA: Diagnosis present

## 2020-05-27 LAB — BASIC METABOLIC PANEL
Anion gap: 10 (ref 5–15)
BUN: 7 mg/dL (ref 6–20)
CO2: 26 mmol/L (ref 22–32)
Calcium: 9 mg/dL (ref 8.9–10.3)
Chloride: 102 mmol/L (ref 98–111)
Creatinine, Ser: 0.79 mg/dL (ref 0.61–1.24)
GFR calc Af Amer: >60 mL/min (ref >60)
GFR calc non Af Amer: >60 mL/min (ref >60)
Glucose, Bld: 139 mg/dL — ABNORMAL HIGH (ref 70–99)
Potassium: 3.9 mmol/L (ref 3.5–5.1)
Sodium: 138 mmol/L (ref 135–145)

## 2020-05-27 LAB — CBC
HCT: 41.7 % (ref 39.0–52.0)
Hemoglobin: 13.9 g/dL (ref 13.0–17.0)
MCH: 30.1 pg (ref 26.0–34.0)
MCHC: 33.3 g/dL (ref 30.0–36.0)
MCV: 90.3 fL (ref 80.0–100.0)
Platelets: 356 10*3/uL (ref 150–400)
RBC: 4.62 MIL/uL (ref 4.22–5.81)
RDW: 13 % (ref 11.5–15.5)
WBC: 10.9 10*3/uL — ABNORMAL HIGH (ref 4.0–10.5)
nRBC: 0 % (ref 0.0–0.2)

## 2020-05-27 LAB — TROPONIN I (HIGH SENSITIVITY): Troponin I (High Sensitivity): 7 ng/L (ref <18)

## 2020-05-27 NOTE — ED Triage Notes (Signed)
Pt brought in via ems from home with right side chest pain.  Pt was seen at [facility] last week with similar sx.  Reports feeling lightheaded.  No sob.  Iv in place.  Pt alert  Speech clear.

## 2020-05-28 ENCOUNTER — Inpatient Hospital Stay
Admission: EM | Admit: 2020-05-28 | Discharge: 2020-06-01 | DRG: 177 | Disposition: A | Discharge status: Home / Self Care | Payer: HRSA Program | Attending: Internal Medicine | Admitting: Internal Medicine

## 2020-05-28 ENCOUNTER — Emergency Department: Payer: HRSA Program

## 2020-05-28 DIAGNOSIS — I2699 Other pulmonary embolism without acute cor pulmonale: Secondary | ICD-10-CM | POA: Diagnosis present

## 2020-05-28 DIAGNOSIS — R911 Solitary pulmonary nodule: Secondary | ICD-10-CM | POA: Diagnosis present

## 2020-05-28 DIAGNOSIS — I10 Essential (primary) hypertension: Secondary | ICD-10-CM | POA: Diagnosis present

## 2020-05-28 DIAGNOSIS — K5900 Constipation, unspecified: Secondary | ICD-10-CM | POA: Diagnosis present

## 2020-05-28 DIAGNOSIS — J1282 Pneumonia due to coronavirus disease 2019: Secondary | ICD-10-CM | POA: Diagnosis present

## 2020-05-28 DIAGNOSIS — R079 Chest pain, unspecified: Secondary | ICD-10-CM

## 2020-05-28 DIAGNOSIS — U071 COVID-19: Principal | ICD-10-CM | POA: Diagnosis present

## 2020-05-28 DIAGNOSIS — R000 Tachycardia, unspecified: Secondary | ICD-10-CM | POA: Diagnosis present

## 2020-05-28 DIAGNOSIS — L0390 Cellulitis, unspecified: Secondary | ICD-10-CM | POA: Diagnosis present

## 2020-05-28 DIAGNOSIS — F17210 Nicotine dependence, cigarettes, uncomplicated: Secondary | ICD-10-CM | POA: Diagnosis present

## 2020-05-28 LAB — ABO/RH: ABO/RH(D): A POS

## 2020-05-28 LAB — CBC
HCT: 41.9 % (ref 39.0–52.0)
Hemoglobin: 13.9 g/dL (ref 13.0–17.0)
MCH: 30 pg (ref 26.0–34.0)
MCHC: 33.2 g/dL (ref 30.0–36.0)
MCV: 90.5 fL (ref 80.0–100.0)
Platelets: 366 10*3/uL (ref 150–400)
RBC: 4.63 MIL/uL (ref 4.22–5.81)
RDW: 13 % (ref 11.5–15.5)
WBC: 9.2 10*3/uL (ref 4.0–10.5)
nRBC: 0 % (ref 0.0–0.2)

## 2020-05-28 LAB — TROPONIN I (HIGH SENSITIVITY): Troponin I (High Sensitivity): 5 ng/L (ref <18)

## 2020-05-28 LAB — FERRITIN: Ferritin: 100 ng/mL (ref 24–336)

## 2020-05-28 LAB — HEPARIN LEVEL (UNFRACTIONATED)
Heparin Unfractionated: 0.5 IU/mL (ref 0.30–0.70)
Heparin Unfractionated: 0.37 IU/mL (ref 0.30–0.70)

## 2020-05-28 LAB — FIBRIN DERIVATIVES D-DIMER (ARMC ONLY): Fibrin derivatives D-dimer (ARMC): 2680.24 ng/mL (FEU) — ABNORMAL HIGH (ref 0.00–499.00)

## 2020-05-28 LAB — BRAIN NATRIURETIC PEPTIDE: B Natriuretic Peptide: 56.4 pg/mL (ref 0.0–100.0)

## 2020-05-28 LAB — APTT: aPTT: 27 seconds (ref 24–36)

## 2020-05-28 LAB — C-REACTIVE PROTEIN: CRP: 10.2 mg/dL — ABNORMAL HIGH (ref <1.0)

## 2020-05-28 LAB — SARS CORONAVIRUS 2 BY RT PCR (HOSPITAL ORDER, PERFORMED IN ~~LOC~~ HOSPITAL LAB): SARS Coronavirus 2: POSITIVE — AB

## 2020-05-28 LAB — PROCALCITONIN: Procalcitonin: <0.1 ng/mL

## 2020-05-28 LAB — HIV ANTIBODY (ROUTINE TESTING W REFLEX): HIV Screen 4th Generation wRfx: NONREACTIVE

## 2020-05-28 LAB — LACTATE DEHYDROGENASE: LDH: 173 U/L (ref 98–192)

## 2020-05-28 LAB — FIBRINOGEN: Fibrinogen: 582 mg/dL — ABNORMAL HIGH (ref 210–475)

## 2020-05-28 LAB — PROTIME-INR
INR: 1 (ref 0.8–1.2)
Prothrombin Time: 13.1 seconds (ref 11.4–15.2)

## 2020-05-28 LAB — HEPATITIS B SURFACE ANTIGEN: Hepatitis B Surface Ag: NONREACTIVE

## 2020-05-28 MED ORDER — SODIUM CHLORIDE 0.9 % IV SOLN: 100.0000 mg | Freq: Every day | INTRAVENOUS | Status: DC

## 2020-05-28 MED ORDER — SODIUM CHLORIDE 0.9 % IV SOLN: 200.0000 mg | Freq: Once | INTRAVENOUS | Status: DC

## 2020-05-28 MED ORDER — SODIUM CHLORIDE 0.9% FLUSH: 3.0000 mL | INTRAVENOUS | Status: DC | PRN

## 2020-05-28 MED ORDER — SODIUM CHLORIDE 0.9 % IV SOLN
200.0000 mg | Freq: Once | INTRAVENOUS | Status: AC
  Administered 2020-05-28: 200 mg via INTRAVENOUS
  Filled 2020-05-28: qty 40

## 2020-05-28 MED ORDER — ACETAMINOPHEN 325 MG PO TABS: 650.0000 mg | ORAL_TABLET | Freq: Four times a day (QID) | ORAL | Status: DC | PRN

## 2020-05-28 MED ORDER — MORPHINE SULFATE (PF) 4 MG/ML IV SOLN
4.0000 mg | Freq: Once | INTRAVENOUS | Status: AC
  Administered 2020-05-28: 4 mg via INTRAVENOUS
  Filled 2020-05-28: qty 1

## 2020-05-28 MED ORDER — ONDANSETRON HCL 4 MG/2ML IJ SOLN: 4.0000 mg | Freq: Four times a day (QID) | INTRAMUSCULAR | Status: DC | PRN

## 2020-05-28 MED ORDER — HYDROCOD POLST-CPM POLST ER 10-8 MG/5ML PO SUER
5.0000 mL | Freq: Once | ORAL | Status: AC
  Administered 2020-05-28: 5 mL via ORAL
  Filled 2020-05-28: qty 5

## 2020-05-28 MED ORDER — SODIUM CHLORIDE 0.9 % IV BOLUS
1000.0000 mL | Freq: Once | INTRAVENOUS | Status: AC
  Administered 2020-05-28: 1000 mL via INTRAVENOUS

## 2020-05-28 MED ORDER — DEXAMETHASONE SODIUM PHOSPHATE 10 MG/ML IJ SOLN
6.0000 mg | INTRAMUSCULAR | Status: DC
  Administered 2020-05-28 – 2020-05-29 (×2): 6 mg via INTRAVENOUS
  Filled 2020-05-28 (×2): qty 1

## 2020-05-28 MED ORDER — SODIUM CHLORIDE 0.9% FLUSH
3.0000 mL | Freq: Two times a day (BID) | INTRAVENOUS | Status: DC
  Administered 2020-05-28 – 2020-06-01 (×9): 3 mL via INTRAVENOUS

## 2020-05-28 MED ORDER — NAPROXEN 500 MG PO TABS
500.0000 mg | ORAL_TABLET | Freq: Once | ORAL | Status: AC
  Administered 2020-05-28: 500 mg via ORAL
  Filled 2020-05-28: qty 1

## 2020-05-28 MED ORDER — ALBUTEROL SULFATE HFA 108 (90 BASE) MCG/ACT IN AERS
2.0000 | INHALATION_SPRAY | Freq: Four times a day (QID) | RESPIRATORY_TRACT | Status: DC
  Administered 2020-05-28 – 2020-06-01 (×17): 2 via RESPIRATORY_TRACT
  Filled 2020-05-28 (×3): qty 6.7

## 2020-05-28 MED ORDER — ASCORBIC ACID 500 MG PO TABS
500.0000 mg | ORAL_TABLET | Freq: Every day | ORAL | Status: DC
  Administered 2020-05-28 – 2020-06-01 (×5): 500 mg via ORAL
  Filled 2020-05-28 (×5): qty 1

## 2020-05-28 MED ORDER — HEPARIN (PORCINE) 25000 UT/250ML-% IV SOLN
2000.0000 [IU]/h | INTRAVENOUS | Status: DC
  Administered 2020-05-28 (×2): 1750 [IU]/h via INTRAVENOUS
  Administered 2020-05-29: 08:00:00 1850 [IU]/h via INTRAVENOUS
  Administered 2020-05-29 – 2020-05-31 (×3): 2000 [IU]/h via INTRAVENOUS
  Filled 2020-05-28 (×6): qty 250

## 2020-05-28 MED ORDER — SODIUM CHLORIDE 0.9 % IV SOLN
100.0000 mg | Freq: Every day | INTRAVENOUS | Status: AC
  Administered 2020-05-29 – 2020-06-01 (×4): 100 mg via INTRAVENOUS
  Filled 2020-05-28 (×4): qty 20

## 2020-05-28 MED ORDER — ONDANSETRON HCL 4 MG/2ML IJ SOLN
4.0000 mg | Freq: Once | INTRAMUSCULAR | Status: AC
  Administered 2020-05-28: 4 mg via INTRAVENOUS
  Filled 2020-05-28: qty 2

## 2020-05-28 MED ORDER — ZINC SULFATE 220 (50 ZN) MG PO CAPS
220.0000 mg | ORAL_CAPSULE | Freq: Every day | ORAL | Status: DC
  Administered 2020-05-28 – 2020-06-01 (×5): 220 mg via ORAL
  Filled 2020-05-28 (×5): qty 1

## 2020-05-28 MED ORDER — DEXAMETHASONE SODIUM PHOSPHATE 10 MG/ML IJ SOLN: 6.0000 mg | INTRAMUSCULAR | Status: DC

## 2020-05-28 MED ORDER — IOHEXOL 350 MG/ML SOLN
75.0000 mL | Freq: Once | INTRAVENOUS | Status: AC | PRN
  Administered 2020-05-28: 75 mL via INTRAVENOUS

## 2020-05-28 MED ORDER — ACETAMINOPHEN 500 MG PO TABS
1000.0000 mg | ORAL_TABLET | Freq: Four times a day (QID) | ORAL | Status: DC | PRN
  Administered 2020-05-28 – 2020-05-29 (×3): 1000 mg via ORAL
  Filled 2020-05-28 (×3): qty 2

## 2020-05-28 MED ORDER — SODIUM CHLORIDE 0.9 % IV SOLN
250.0000 mL | INTRAVENOUS | Status: DC | PRN
  Administered 2020-05-29 – 2020-05-31 (×3): 250 mL via INTRAVENOUS

## 2020-05-28 MED ORDER — ONDANSETRON HCL 4 MG PO TABS: 4.0000 mg | ORAL_TABLET | Freq: Four times a day (QID) | ORAL | Status: DC | PRN

## 2020-05-28 MED ORDER — HEPARIN (PORCINE) 25000 UT/250ML-% IV SOLN: 16.0000 [IU]/kg/h | INTRAVENOUS | Status: DC

## 2020-05-28 MED ORDER — HEPARIN SODIUM (PORCINE) 5000 UNIT/ML IJ SOLN: 4000.0000 [IU] | Freq: Once | INTRAMUSCULAR | Status: DC

## 2020-05-28 MED ORDER — GUAIFENESIN-DM 100-10 MG/5ML PO SYRP
10.0000 mL | ORAL_SOLUTION | ORAL | Status: DC | PRN
  Filled 2020-05-28: qty 10

## 2020-05-28 MED ORDER — HEPARIN BOLUS VIA INFUSION
6000.0000 [IU] | Freq: Once | INTRAVENOUS | Status: AC
  Administered 2020-05-28: 6000 [IU] via INTRAVENOUS
  Filled 2020-05-28: qty 6000

## 2020-05-28 NOTE — ED Provider Notes (Signed)
[facility] Emergency Department Provider Note   ____________________________________________   First MD Initiated Contact with Patient 05/28/20 [phone]     (approximate)  I have reviewed the triage vital signs and the nursing notes.   HISTORY  Chief Complaint Chest Pain    HPI Keith Johnston is a 59 y.o. male brought to the ED via EMS from home with a chief complaint of right-sided chest pain.  Patient was seen at [facility] last week with similar symptoms.  States he tries not to cough because it hurts his chest.  Was having productive cough and lightheadedness.  Denies fever, shortness of breath, abdominal pain, nausea, vomiting or diarrhea.  Patient is unvaccinated against COVID-19.      Past medical history None  Patient Active Problem List   Diagnosis Date Noted  . Sepsis affecting skin   . Cellulitis of left lower extremity   . Cellulitis 04/28/2015    Past Surgical History:  Procedure Laterality Date  . INCISION AND DRAINAGE ABSCESS Left 05/01/2015   Procedure: INCISION AND DRAINAGE ABSCESS;  Surgeon: Natale Lay, MD;  Location: [facility] ORS;  Service: General;  Laterality: Left;    Prior to Admission medications   Not on File    Allergies Patient has no known allergies.  No family history on file.  Social History Social History   Tobacco Use  . Smoking status: Current Every Day Smoker    Packs/day: 1.00    Types: Cigarettes  . Smokeless tobacco: Never Used  Substance Use Topics  . Alcohol use: Yes    Alcohol/week: 12.0 standard drinks    Types: 12 Cans of beer per week  . Drug use: No    Review of Systems  Constitutional: No fever/chills Eyes: No visual changes. ENT: No sore throat. Cardiovascular: Positive for chest pain. Respiratory: Positive for productive cough.  Denies shortness of breath. Gastrointestinal: No abdominal pain.  No nausea, no vomiting.  No diarrhea.  No constipation. Genitourinary: Negative for  dysuria. Musculoskeletal: Negative for back pain. Skin: Negative for rash. Neurological: Negative for headaches, focal weakness or numbness.   ____________________________________________   PHYSICAL EXAM:  VITAL SIGNS: ED Triage Vitals  Enc Vitals Group     BP 05/27/20 1924 133/78     Pulse Rate 05/27/20 1924 (!) 111     Resp 05/27/20 1924 20     Temp 05/27/20 1924 99.4 F (37.4 C)     Temp Source 05/27/20 1924 Oral     SpO2 05/27/20 1924 96 %     Weight 05/27/20 1922 240 lb (108.9 kg)     Height 05/27/20 1922 6\' 6"  (1.981 m)     Head Circumference --      Peak Flow --      Pain Score 05/27/20 1922 10     Pain Loc --      Pain Edu? --      Excl. in GC? --     Constitutional: Alert and oriented. Well appearing and in mild acute distress. Eyes: Conjunctivae are normal. PERRL. EOMI. Head: Atraumatic. Nose: No congestion/rhinnorhea. Mouth/Throat: Mucous membranes are moist.   Neck: No stridor.   Cardiovascular: Normal rate, regular rhythm. Grossly normal heart sounds.  Good peripheral circulation. Respiratory: Normal respiratory effort.  No retractions.  Equal rise and fall. Gastrointestinal: Soft and nontender. No distention. No abdominal bruits. No CVA tenderness. Musculoskeletal: No lower extremity tenderness nor edema.  No joint effusions. Neurologic:  Normal speech and language. No gross focal neurologic  deficits are appreciated. No gait instability. Skin:  Skin is warm, dry and intact. No rash noted.  No petechiae. Psychiatric: Mood and affect are normal. Speech and behavior are normal.  ____________________________________________   LABS (all labs ordered are listed, but only abnormal results are displayed)  Labs Reviewed  SARS CORONAVIRUS 2 BY RT PCR (HOSPITAL ORDER, PERFORMED IN [facility]) - Abnormal; Notable for the following components:      Result Value   SARS Coronavirus 2 POSITIVE (*)    All other components within normal limits  BASIC  METABOLIC PANEL - Abnormal; Notable for the following components:   Glucose, Bld 139 (*)    All other components within normal limits  CBC - Abnormal; Notable for the following components:   WBC 10.9 (*)    All other components within normal limits  HIV ANTIBODY (ROUTINE TESTING W REFLEX)  BRAIN NATRIURETIC PEPTIDE  C-REACTIVE PROTEIN  FIBRIN DERIVATIVES D-DIMER ([facility] ONLY)  FERRITIN  FIBRINOGEN  HEPATITIS B SURFACE ANTIGEN  LACTATE DEHYDROGENASE  PROCALCITONIN  ABO/RH  TROPONIN I (HIGH SENSITIVITY)  TROPONIN I (HIGH SENSITIVITY)   ____________________________________________  EKG  ED ECG REPORT I, Joscelyn Hardrick J, the attending physician, personally viewed and interpreted this ECG.   Date: 05/28/2020  EKG Time: 1937  Rate: 114  Rhythm: sinus tachycardia  Axis: Normal  Intervals:none  ST&T Change: Nonspecific  ____________________________________________  RADIOLOGY  ED MD interpretation: Lingular infiltrate; possible pneumonia on chest x-ray; CT chest discussed with Dr. Grace Isaac from radiology which demonstrates multifocal acute pulmonary emboli with right middle lobe pulmonary infarct  Official radiology report(s): DG Chest 2 View  Result Date: 05/27/2020 CLINICAL DATA:  Chest pain EXAM: CHEST - 2 VIEW COMPARISON:  05/18/2020 FINDINGS: Patchy airspace disease in the lingula has developed in the interval. No effusion. Right lung clear. Mild pulmonary hyperinflation. Heart size and vascularity normal. IMPRESSION: Interval development of lingular infiltrate.  Possible pneumonia. Electronically Signed   By: Marlan Palau M.D.   On: 05/27/2020 19:49   CT Angio Chest PE W/Cm &/Or Wo Cm  Result Date: 05/28/2020 CLINICAL DATA:  Right-sided chest pain EXAM: CT ANGIOGRAPHY CHEST WITH CONTRAST TECHNIQUE: Multidetector CT imaging of the chest was performed using the standard protocol during bolus administration of intravenous contrast. Multiplanar CT image reconstructions and MIPs were  obtained to evaluate the vascular anatomy. CONTRAST:  40mL OMNIPAQUE IOHEXOL 350 MG/ML SOLN COMPARISON:  None. FINDINGS: Cardiovascular: Normal heart size. No right heart dilatation. Mild coronary calcification. Positive for pulmonary artery filling defects seen at the right middle lobar artery, posterior segment right upper lobe, subsegmental posterior basal segment left lower lobe, and anterior basal segment right lower lobe. Mediastinum/Nodes:   Negative for adenopathy or mass. Lungs/Pleura: Geographic area of ground-glass opacity in the right middle lobe, consistent with ischemia/infarct. Mild ischemic changes may also be present in the right lower lobe. Atelectasis on both sides. History of COVID, no typical ground-glass opacities. No pleural effusion or failure. 7 mm right middle lobe pulmonary nodule. Upper Abdomen: No acute finding in the arterial phase. A cystic density is seen in the posterior right liver with water densitometry on coronal reformats. Musculoskeletal: No acute or aggressive finding. Review of the MIP images confirms the above findings. Critical Value/emergent results were called by telephone at the time of interpretation on 05/28/2020 at 5:02 am to provider Killian Schwer , who verbally acknowledged these results. IMPRESSION: 1. Multifocal acute pulmonary emboli from lobar to subsegmental in size with pulmonary infarct at the right  middle lobe. 2. 7 mm right middle lobe pulmonary nodule. Non-contrast chest CT at 6-12 months is recommended. If the nodule is stable at time of repeat CT, then future CT at 18-24 months (from today's scan) is considered optional for low-risk patients, but is recommended for high-risk patients. This recommendation follows the consensus statement: Guidelines for Management of Incidental Pulmonary Nodules Detected on CT Images: From the Fleischner Society 2017; Radiology 2017; 284:228-243. 3. Coronary atherosclerosis. Electronically Signed   By: Marnee Spring M.D.   On:  05/28/2020 05:03    ____________________________________________   PROCEDURES  Procedure(s) performed (including Critical Care):  .1-3 Lead EKG Interpretation Performed by: Irean Hong, MD Authorized by: Irean Hong, MD     Interpretation: normal     ECG rate:  68   ECG rate assessment: normal     Rhythm: sinus rhythm     Ectopy: none     Conduction: normal   Comments:     Patient placed on cardiac monitor to evaluate for arrhythmias    CRITICAL CARE Performed by: Irean Hong   Total critical care time: 45 minutes  Critical care time was exclusive of separately billable procedures and treating other patients.  Critical care was necessary to treat or prevent imminent or life-threatening deterioration.  Critical care was time spent personally by me on the following activities: development of treatment plan with patient and/or surrogate as well as nursing, discussions with consultants, evaluation of patient's response to treatment, examination of patient, obtaining history from patient or surrogate, ordering and performing treatments and interventions, ordering and review of laboratory studies, ordering and review of radiographic studies, pulse oximetry and re-evaluation of patient's condition.  ____________________________________________   INITIAL IMPRESSION / ASSESSMENT AND PLAN / ED COURSE  As part of my medical decision making, I reviewed the following data within the electronic MEDICAL RECORD NUMBER Nursing notes reviewed and incorporated, Labs reviewed, EKG interpreted, Old chart reviewed, Radiograph reviewed , Notes from prior ED visits and [place] Controlled Substance Database     Keith Johnston was evaluated in Emergency Department on 05/28/2020 for the symptoms described in the history of present illness. He was evaluated in the context of the global COVID-19 pandemic, which necessitated consideration that the patient might be at risk for infection with the SARS-CoV-2 virus  that causes COVID-19. Institutional protocols and algorithms that pertain to the evaluation of patients at risk for COVID-19 are in a state of rapid change based on information released by regulatory bodies including the CDC and federal and state organizations. These policies and algorithms were followed during the patient's care in the ED.    59 year old male presenting with chest pain. Differential diagnosis includes, but is not limited to, ACS, aortic dissection, pulmonary embolism, cardiac tamponade, pneumothorax, pneumonia, pericarditis, myocarditis, GI-related causes including esophagitis/gastritis, and musculoskeletal chest wall pain.    Laboratory results demonstrate very mild leukocytosis, 2 sets of unremarkable troponins, lingular infiltrate on chest x-ray and COVID+.  Will administer IV analgesia and obtain CTA chest to evaluate for PE.   Clinical Course as of May 28 513  Fri May 28, 2020  3016 Updated patient of CT imaging results.  Have ordered IV Remdesivir, Decadron, heparin bolus and drip.  Will discuss with hospitalist services for admission.   [JS]    Clinical Course User Index [JS] Irean Hong, MD     ____________________________________________   FINAL CLINICAL IMPRESSION(S) / ED DIAGNOSES  Final diagnoses:  Nonspecific chest pain  Pneumonia due to  COVID-19 virus  Acute pulmonary embolism without acute cor pulmonale, unspecified pulmonary embolism type [facility](HCC)     ED Discharge Orders    None       Note:  This document was prepared using Dragon voice recognition software and may include unintentional dictation errors.   Irean HongSung, Jasimine Simms J, MD 05/28/20 [phone]

## 2020-05-28 NOTE — Progress Notes (Signed)
ANTICOAGULATION CONSULT NOTE - Follow up Consult  Pharmacy Consult for Heparin Indication: pulmonary embolus  No Known Allergies  Patient Measurements: Height: 6\' 6"  (198.1 cm) Weight: 108.9 kg (240 lb) IBW/kg (Calculated) : 91.4 HEPARIN DW (KG): 108.9  Vital Signs: Temp: 97.6 F (36.4 C) (09/03 2018) Temp Source: Oral (09/03 2018) BP: 152/89 (09/03 2018) Pulse Rate: 64 (09/03 2018)  Labs: Recent Labs    05/27/20 1925 05/27/20 2346 05/28/20 0539 05/28/20 1200 05/28/20 1316 05/28/20 2043  HGB 13.9  --   --  13.9  --   --   HCT 41.7  --   --  41.9  --   --   PLT 356  --   --  366  --   --   APTT  --   --  27  --   --   --   LABPROT  --   --  13.1  --   --   --   INR  --   --  1.0  --   --   --   HEPARINUNFRC  --   --   --   --  0.50 0.37  CREATININE 0.79  --   --   --   --   --   TROPONINIHS 7 5  --   --   --   --     Estimated Creatinine Clearance: 131.7 mL/min (by C-G formula based on SCr of 0.79 mg/dL).  Assessment: 59 yo male found to have PE. Baseline labs ordered.  Heparin for PE.  No anticoagulants PTA.   Heparin Course: Heparin 6000 units bolus x 1 then infusion at 1750 units/hr   Goal of Therapy:  Heparin level 0.3-0.7 units/ml Monitor platelets by anticoagulation protocol: Yes   Plan:  0903 @ 2043 HL 0.37, therapeutic x 2, continue heparin drip at current rate Recheck HL in am CBC in AM  Pharmacy will continue to follow.   2044 A 05/28/2020,10:15 PM

## 2020-05-28 NOTE — Progress Notes (Signed)
Remdesivir - Pharmacy Brief Note     A/P:  Remdesivir 200 mg IVPB once followed by 100 mg IVPB daily x 4 days.   Valrie Hart, PharmD Clinical Pharmacist  05/28/2020 5:17 AM

## 2020-05-28 NOTE — Progress Notes (Signed)
ANTICOAGULATION CONSULT NOTE - Follow up Consult  Pharmacy Consult for Heparin Indication: pulmonary embolus  No Known Allergies  Patient Measurements: Height: 6\' 6"  (198.1 cm) Weight: 108.9 kg (240 lb) IBW/kg (Calculated) : 91.4 HEPARIN DW (KG): 108.9  Vital Signs: Temp: 98.3 F (36.8 C) (09/03 1224) Temp Source: Oral (09/03 1224) BP: 153/96 (09/03 1224) Pulse Rate: 75 (09/03 1224)  Labs: Recent Labs    05/27/20 1925 05/27/20 2346 05/28/20 0539 05/28/20 1200 05/28/20 1316  HGB 13.9  --   --  13.9  --   HCT 41.7  --   --  41.9  --   PLT 356  --   --  366  --   APTT  --   --  27  --   --   LABPROT  --   --  13.1  --   --   INR  --   --  1.0  --   --   HEPARINUNFRC  --   --   --   --  0.50  CREATININE 0.79  --   --   --   --   TROPONINIHS 7 5  --   --   --     Estimated Creatinine Clearance: 131.7 mL/min (by C-G formula based on SCr of 0.79 mg/dL).    Assessment: 59 yo male found to have PE. Baseline labs ordered.  Heparin for PE.  No anticoagulants PTA.   Heparin Course: Heparin 6000 units bolus x 1 then infusion at 1750 units/hr   Goal of Therapy:  Heparin level 0.3-0.7 units/ml Monitor platelets by anticoagulation protocol: Yes   Plan:  HL 0.50, therapeutic x1, continue heparin drip at current rate Recheck HL in 6 hrs to confirm CBC in AM  Pharmacy will continue to follow.   58 05/28/2020,2:14 PM

## 2020-05-28 NOTE — Progress Notes (Signed)
ANTICOAGULATION CONSULT NOTE - Initial Consult  Pharmacy Consult for Heparin Indication: pulmonary embolus  No Known Allergies  Patient Measurements: Height: 6\' 6"  (198.1 cm) Weight: 108.9 kg (240 lb) IBW/kg (Calculated) : 91.4 HEPARIN DW (KG): 108.9  Vital Signs: Temp: 99 F (37.2 C) (09/02 2354) Temp Source: Oral (09/02 2354) BP: 167/94 (09/03 0358) Pulse Rate: 79 (09/03 0358)  Labs: Recent Labs    05/27/20 1925 05/27/20 2346  HGB 13.9  --   HCT 41.7  --   PLT 356  --   CREATININE 0.79  --   TROPONINIHS 7 5    Estimated Creatinine Clearance: 131.7 mL/min (by C-G formula based on SCr of 0.79 mg/dL).   Medical History: History reviewed. No pertinent past medical history.  Medications:  (Not in a hospital admission)   Assessment: Baseline labs ordered.  Heparin for PE.  No anticoagulants PTA.   Goal of Therapy:  Heparin level 0.3-0.7 units/ml Monitor platelets by anticoagulation protocol: Yes   Plan:  Heparin 6000 units bolus x 1 then infusion at 1750 units/hr Check HL in 6 hrs  07/27/20, Wyona Neils A 05/28/2020,5:25 AM

## 2020-05-28 NOTE — H&P (Signed)
History and Physical    Keith Johnston UVO:[id number] DOB: 08-05-1961 DOA: 05/28/2020  PCP: Patient, No Pcp Per   Patient coming from: Home  I have personally briefly reviewed patient's old medical records in [facility]  Chief Complaint: Chest pain  HPI: Keith Johnston is a 59 y.o. male with no significant past medical history who presents to the emergency room for evaluation of right-sided chest pain.  Patient was seen at [facility] last week with similar symptoms.  He ruled out for an acute coronary syndrome during that ER visit and was advised to follow-up with cardiology as an outpatient.  Chest pain is rated 6 x 10 in intensity at its worst and is nonradiating.  It is aggravated by coughing.  Chest pain is also associated with lightheadedness and a productive cough.  Patient denies having any fever, no shortness of breath, no abdominal pain, no nausea, no vomiting or diarrhea. He is unvaccinated against COVID-19 and denies having any sick contacts. Labs show sodium 138, potassium 3.9, chloride 102, bicarb 26, BUN 7, creatinine 0.79, BNP 56.4, troponin V, white count 10.9, hemoglobin 13.9, hematocrit 41.7, fibrinogen 582, fibrin degradation 2680, His SARS coronavirus 2 PCR test is positive. Chest x-ray reviewed by me shows haziness in the right lower lung field. CT angiogram shows multifocal acute pulmonary emboli from lobar to subsegmental in size with pulmonary infarct at the right middle lobe. 7 mm right middle lobe pulmonary nodule.  Noncontrast chest CT at 6 to 12 months is recommended. Twelve-lead EKG reviewed by me shows sinus tachycardia   ED Course: Patient is a 59 year old male who presented to the ER for evaluation of right sided chest pain.  Patient had been evaluated a week following for chest pain and ruled out for an acute coronary syndrome.  Chest x-ray showed a right middle lobe infiltrate and a CT angiogram confirmed acute pulmonary embolism with pulmonary  infarct.  Patient tested positive for the COVID-19 virus and is unvaccinated.  He was started on a heparin drip in the ER and received remdesivir and Decadron.  He will be admitted to the hospital for further evaluation.  Review of Systems: As per HPI otherwise 10 point review of systems negative.    History reviewed. No pertinent past medical history.  Past Surgical History:  Procedure Laterality Date  . INCISION AND DRAINAGE ABSCESS Left 05/01/2015   Procedure: INCISION AND DRAINAGE ABSCESS;  Surgeon: Natale Lay, MD;  Location: [facility] ORS;  Service: General;  Laterality: Left;     reports that he has been smoking cigarettes. He has been smoking about 1.00 pack per day. He has never used smokeless tobacco. He reports current alcohol use of about 12.0 standard drinks of alcohol per week. He reports that he does not use drugs.  No Known Allergies  No family history on file.   Prior to Admission medications   Not on File    Physical Exam: Vitals:   05/27/20 1924 05/27/20 2354 05/28/20 0358 05/28/20 0640  BP: 133/78 119/81 (!) 167/94 (!) 154/94  Pulse: (!) 111 93 79 91  Resp: 20 19 (!) 23 (!) 26  Temp: 99.4 F (37.4 C) 99 F (37.2 C)    TempSrc: Oral Oral    SpO2: 96% 98% 97% 92%  Weight:      Height:         Vitals:   05/27/20 1924 05/27/20 2354 05/28/20 0358 05/28/20 0640  BP: 133/78 119/81 (!) 167/94 (!) 154/94  Pulse: Marland Kitchen)  111 93 79 91  Resp: 20 19 (!) 23 (!) 26  Temp: 99.4 F (37.4 C) 99 F (37.2 C)    TempSrc: Oral Oral    SpO2: 96% 98% 97% 92%  Weight:      Height:        Constitutional: NAD, alert and oriented x 3.  Appears comfortable and in no distress Eyes: PERRL, lids and conjunctivae normal ENMT: Mucous membranes are moist.  Neck: normal, supple, no masses, no thyromegaly Respiratory: clear to auscultation bilaterally, no wheezing, no crackles. Normal respiratory effort. No accessory muscle use.  Cardiovascular: Regular rate and rhythm, no murmurs / rubs  / gallops. No extremity edema. 2+ pedal pulses. No carotid bruits.  Abdomen: no tenderness, no masses palpated. No hepatosplenomegaly. Bowel sounds positive.  Musculoskeletal: no clubbing / cyanosis. No joint deformity upper and lower extremities.  Skin: no rashes, lesions, ulcers.  Neurologic: No gross focal neurologic deficit. Psychiatric: Normal mood and affect.   Labs on Admission: I have personally reviewed following labs and imaging studies  CBC: Recent Labs  Lab 05/27/20 1925  WBC 10.9*  HGB 13.9  HCT 41.7  MCV 90.3  PLT 356   Basic Metabolic Panel: Recent Labs  Lab 05/27/20 1925  NA 138  K 3.9  CL 102  CO2 26  GLUCOSE 139*  BUN 7  CREATININE 0.79  CALCIUM 9.0   GFR: Estimated Creatinine Clearance: 131.7 mL/min (by C-G formula based on SCr of 0.79 mg/dL). Liver Function Tests: No results for input(s): AST, ALT, ALKPHOS, BILITOT, PROT, ALBUMIN in the last 168 hours. No results for input(s): LIPASE, AMYLASE in the last 168 hours. No results for input(s): AMMONIA in the last 168 hours. Coagulation Profile: Recent Labs  Lab 05/28/20 0539  INR 1.0   Cardiac Enzymes: No results for input(s): CKTOTAL, CKMB, CKMBINDEX, TROPONINI in the last 168 hours. BNP (last 3 results) No results for input(s): PROBNP in the last 8760 hours. HbA1C: No results for input(s): HGBA1C in the last 72 hours. CBG: No results for input(s): GLUCAP in the last 168 hours. Lipid Profile: No results for input(s): CHOL, HDL, LDLCALC, TRIG, CHOLHDL, LDLDIRECT in the last 72 hours. Thyroid Function Tests: No results for input(s): TSH, T4TOTAL, FREET4, T3FREE, THYROIDAB in the last 72 hours. Anemia Panel: Recent Labs    05/28/20 0539  FERRITIN 100   Urine analysis: No results found for: COLORURINE, APPEARANCEUR, LABSPEC, PHURINE, GLUCOSEU, HGBUR, BILIRUBINUR, KETONESUR, PROTEINUR, UROBILINOGEN, NITRITE, LEUKOCYTESUR  Radiological Exams on Admission: DG Chest 2 View  Result Date:  05/27/2020 CLINICAL DATA:  Chest pain EXAM: CHEST - 2 VIEW COMPARISON:  05/18/2020 FINDINGS: Patchy airspace disease in the lingula has developed in the interval. No effusion. Right lung clear. Mild pulmonary hyperinflation. Heart size and vascularity normal. IMPRESSION: Interval development of lingular infiltrate.  Possible pneumonia. Electronically Signed   By: Marlan Palau M.D.   On: 05/27/2020 19:49   CT Angio Chest PE W/Cm &/Or Wo Cm  Result Date: 05/28/2020 CLINICAL DATA:  Right-sided chest pain EXAM: CT ANGIOGRAPHY CHEST WITH CONTRAST TECHNIQUE: Multidetector CT imaging of the chest was performed using the standard protocol during bolus administration of intravenous contrast. Multiplanar CT image reconstructions and MIPs were obtained to evaluate the vascular anatomy. CONTRAST:  7mL OMNIPAQUE IOHEXOL 350 MG/ML SOLN COMPARISON:  None. FINDINGS: Cardiovascular: Normal heart size. No right heart dilatation. Mild coronary calcification. Positive for pulmonary artery filling defects seen at the right middle lobar artery, posterior segment right upper lobe, subsegmental posterior basal  segment left lower lobe, and anterior basal segment right lower lobe. Mediastinum/Nodes:   Negative for adenopathy or mass. Lungs/Pleura: Geographic area of ground-glass opacity in the right middle lobe, consistent with ischemia/infarct. Mild ischemic changes may also be present in the right lower lobe. Atelectasis on both sides. History of COVID, no typical ground-glass opacities. No pleural effusion or failure. 7 mm right middle lobe pulmonary nodule. Upper Abdomen: No acute finding in the arterial phase. A cystic density is seen in the posterior right liver with water densitometry on coronal reformats. Musculoskeletal: No acute or aggressive finding. Review of the MIP images confirms the above findings. Critical Value/emergent results were called by telephone at the time of interpretation on 05/28/2020 at 5:02 am to provider  JADE SUNG , who verbally acknowledged these results. IMPRESSION: 1. Multifocal acute pulmonary emboli from lobar to subsegmental in size with pulmonary infarct at the right middle lobe. 2. 7 mm right middle lobe pulmonary nodule. Non-contrast chest CT at 6-12 months is recommended. If the nodule is stable at time of repeat CT, then future CT at 18-24 months (from today's scan) is considered optional for low-risk patients, but is recommended for high-risk patients. This recommendation follows the consensus statement: Guidelines for Management of Incidental Pulmonary Nodules Detected on CT Images: From the Fleischner Society 2017; Radiology 2017; 284:228-243. 3. Coronary atherosclerosis. Electronically Signed   By: Marnee Spring M.D.   On: 05/28/2020 05:03    EKG: Independently reviewed.  Sinus tachycardia  Assessment/Plan Principal Problem:   Pneumonia due to COVID-19 virus Active Problems:   Acute pulmonary embolism (HCC)   Nodule of middle lobe of right lung      Pneumonia due to COVID-19 virus We will place patient on remdesivir per protocol and Decadron We will place patient on oxygen supplementation to maintain pulse oximetry greater than 92%   Acute pulmonary embolism Patient noted to have multifocal acute pulmonary emboli from lobar to subsegmental in size with pulmonary infarct at the right middle lobe. Patient is currently on a heparin drip Will be transitioned to oral anticoagulant therapy   Nodule of middle lobe of right lung 7 mm right middle lobe pulmonary nodule. Non-contrast chest CT at 6-12 months is recommended.    DVT prophylaxis: Heparin Code Status: Full code Family Communication: Greater than 50% of time was spent discussing plan of care with patient at the bedside.  All questions and concerns have been addressed.  He verbalizes understanding and agrees with the plan. Disposition Plan: Back to previous home environment Consults called: None    Ethelreda Sukhu MD [facility]     05/28/2020, 7:47 AM

## 2020-05-29 ENCOUNTER — Inpatient Hospital Stay: Payer: HRSA Program

## 2020-05-29 LAB — FERRITIN: Ferritin: 171 ng/mL (ref 24–336)

## 2020-05-29 LAB — COMPREHENSIVE METABOLIC PANEL
ALT: 33 U/L (ref 0–44)
AST: 20 U/L (ref 15–41)
Albumin: 3.2 g/dL — ABNORMAL LOW (ref 3.5–5.0)
Alkaline Phosphatase: 88 U/L (ref 38–126)
Anion gap: 7 (ref 5–15)
BUN: 14 mg/dL (ref 6–20)
CO2: 24 mmol/L (ref 22–32)
Calcium: 8.5 mg/dL — ABNORMAL LOW (ref 8.9–10.3)
Chloride: 107 mmol/L (ref 98–111)
Creatinine, Ser: 0.81 mg/dL (ref 0.61–1.24)
GFR calc Af Amer: >60 mL/min (ref >60)
GFR calc non Af Amer: >60 mL/min (ref >60)
Glucose, Bld: 114 mg/dL — ABNORMAL HIGH (ref 70–99)
Potassium: 3.8 mmol/L (ref 3.5–5.1)
Sodium: 138 mmol/L (ref 135–145)
Total Bilirubin: 0.7 mg/dL (ref 0.3–1.2)
Total Protein: 6.7 g/dL (ref 6.5–8.1)

## 2020-05-29 LAB — MAGNESIUM: Magnesium: 2.3 mg/dL (ref 1.7–2.4)

## 2020-05-29 LAB — CBC WITH DIFFERENTIAL/PLATELET
Abs Immature Granulocytes: 0.06 10*3/uL (ref 0.00–0.07)
Basophils Absolute: 0 10*3/uL (ref 0.0–0.1)
Basophils Relative: 0 %
Eosinophils Absolute: 0.1 10*3/uL (ref 0.0–0.5)
Eosinophils Relative: 1 %
HCT: 39.5 % (ref 39.0–52.0)
Hemoglobin: 13.2 g/dL (ref 13.0–17.0)
Immature Granulocytes: 1 %
Lymphocytes Relative: 13 %
Lymphs Abs: 1.3 10*3/uL (ref 0.7–4.0)
MCH: 30.6 pg (ref 26.0–34.0)
MCHC: 33.4 g/dL (ref 30.0–36.0)
MCV: 91.4 fL (ref 80.0–100.0)
Monocytes Absolute: 0.7 10*3/uL (ref 0.1–1.0)
Monocytes Relative: 7 %
Neutro Abs: 8 10*3/uL — ABNORMAL HIGH (ref 1.7–7.7)
Neutrophils Relative %: 78 %
Platelets: 335 10*3/uL (ref 150–400)
RBC: 4.32 MIL/uL (ref 4.22–5.81)
RDW: 13.1 % (ref 11.5–15.5)
WBC: 10.3 10*3/uL (ref 4.0–10.5)
nRBC: 0 % (ref 0.0–0.2)

## 2020-05-29 LAB — HEPARIN LEVEL (UNFRACTIONATED)
Heparin Unfractionated: 0.24 IU/mL — ABNORMAL LOW (ref 0.30–0.70)
Heparin Unfractionated: 0.26 IU/mL — ABNORMAL LOW (ref 0.30–0.70)

## 2020-05-29 LAB — C-REACTIVE PROTEIN: CRP: 12.3 mg/dL — ABNORMAL HIGH (ref <1.0)

## 2020-05-29 LAB — PHOSPHORUS: Phosphorus: 3.4 mg/dL (ref 2.5–4.6)

## 2020-05-29 LAB — FIBRIN DERIVATIVES D-DIMER (ARMC ONLY): Fibrin derivatives D-dimer (ARMC): 1990.78 ng/mL (FEU) — ABNORMAL HIGH (ref 0.00–499.00)

## 2020-05-29 MED ORDER — METHYLPREDNISOLONE SODIUM SUCC 125 MG IJ SOLR
60.0000 mg | Freq: Two times a day (BID) | INTRAMUSCULAR | Status: DC
  Administered 2020-05-29 – 2020-05-31 (×6): 60 mg via INTRAVENOUS
  Filled 2020-05-29 (×6): qty 2

## 2020-05-29 MED ORDER — TRAMADOL HCL 50 MG PO TABS
100.0000 mg | ORAL_TABLET | Freq: Four times a day (QID) | ORAL | Status: DC | PRN
  Administered 2020-05-29: 100 mg via ORAL
  Filled 2020-05-29: qty 2

## 2020-05-29 MED ORDER — DOCUSATE SODIUM 100 MG PO CAPS
100.0000 mg | ORAL_CAPSULE | Freq: Two times a day (BID) | ORAL | Status: DC
  Administered 2020-05-29 – 2020-06-01 (×6): 100 mg via ORAL
  Filled 2020-05-29 (×6): qty 1

## 2020-05-29 MED ORDER — HEPARIN BOLUS VIA INFUSION
1500.0000 [IU] | Freq: Once | INTRAVENOUS | Status: AC
  Administered 2020-05-29: 18:00:00 1500 [IU] via INTRAVENOUS
  Filled 2020-05-29: qty 1500

## 2020-05-29 MED ORDER — GUAIFENESIN ER 600 MG PO TB12
1200.0000 mg | ORAL_TABLET | Freq: Two times a day (BID) | ORAL | Status: DC
  Administered 2020-05-29 – 2020-06-01 (×7): 1200 mg via ORAL
  Filled 2020-05-29 (×7): qty 2

## 2020-05-29 NOTE — Progress Notes (Signed)
PROGRESS NOTE    Keith Johnston  VQQ:[id number] DOB: 08-13-1961 DOA: 05/28/2020 PCP: Patient, No Pcp Per    Chief Complaint  Patient presents with  . Chest Pain    Brief Narrative:  HPI per Dr. Blenda Peals Luckman is a 59 y.o. male with no significant past medical history who presents to the emergency room for evaluation of right-sided chest pain.  Patient was seen at [facility] last week with similar symptoms.  He ruled out for an acute coronary syndrome during that ER visit and was advised to follow-up with cardiology as an outpatient.  Chest pain is rated 6 x 10 in intensity at its worst and is nonradiating.  It is aggravated by coughing.  Chest pain is also associated with lightheadedness and a productive cough.  Patient denies having any fever, no shortness of breath, no abdominal pain, no nausea, no vomiting or diarrhea. He is unvaccinated against COVID-19 and denies having any sick contacts. Labs show sodium 138, potassium 3.9, chloride 102, bicarb 26, BUN 7, creatinine 0.79, BNP 56.4, troponin V, white count 10.9, hemoglobin 13.9, hematocrit 41.7, fibrinogen 582, fibrin degradation 2680, His SARS coronavirus 2 PCR test is positive. Chest x-ray reviewed by me shows haziness in the right lower lung field. CT angiogram shows multifocal acute pulmonary emboli from lobar to subsegmental in size with pulmonary infarct at the right middle lobe. 7 mm right middle lobe pulmonary nodule.  Noncontrast chest CT at 6 to 12 months is recommended. Twelve-lead EKG reviewed by me shows sinus tachycardia   ED Course: Patient is a 59 year old male who presented to the ER for evaluation of right sided chest pain.  Patient had been evaluated a week following for chest pain and ruled out for an acute coronary syndrome.  Chest x-ray showed a right middle lobe infiltrate and a CT angiogram confirmed acute pulmonary embolism with pulmonary infarct.  Patient tested positive for the COVID-19 virus and  is unvaccinated.  He was started on a heparin drip in the ER and received remdesivir and Decadron.  He will be admitted to the hospital for further evaluation.   Assessment & Plan:   Principal Problem:   Pneumonia due to COVID-19 virus Active Problems:   Acute pulmonary embolism (HCC)   Nodule of middle lobe of right lung  #1 pneumonia due to COVID-19 virus Presented to the ED with right-sided chest pain ongoing x1 week, ACS ruled out, chest x-ray was concerning for right middle lobe infiltrate.  CT chest done concerning for acute PE and pulmonary infarct.  Patient unvaccinated and tested positive for COVID-19 virus.  Follow inflammatory markers.  Continue IV remdesivir.  Patient received a one-time dose of Decadron in the ED.  Start Solu-Medrol 60 mg IV every 12 hours Mucinex, scheduled albuterol MDI.  Continue zinc and vitamin C.  Supportive care.  2.  Acute right-sided pulmonary emboli Questionable etiology.  Patient does state mother with a history of PEs.  Patient denies any recent long car rides or travel, no recent surgeries.  Patient already started on anticoagulation with IV heparin on admission and as such unable to obtain a hypercoagulable panel at this time.  2D echo pending.  Check lower extremity Dopplers.  Continue IV heparin for another 24 to 48 hours and if continued clinical improvement could likely transition to oral NOAC.  Supportive care.  Follow.  3.  Nodule of middle lobe right lung CT angiogram chest with 7 mm right middle lobe pulmonary nodule.  Will need  repeat CT scans in 6 to 12 months for follow-up.  Will likely need outpatient referral to pulmonary.  Follow.  4.   DVT prophylaxis: Heparin Code Status: Full Family Communication: Updated patient.  No family at bedside. Disposition:   Status is: Inpatient    Dispo: The patient is from: Home              Anticipated d/c is to: Home              Anticipated d/c date is: 3 to 4 days.              Patient  currently admitted with acute right-sided PE, on IV heparin, 2D echo and lower extremity Dopplers pending.  Not stable for discharge.       Consultants:   None  Procedures:   CT angiogram chest 05/28/2020  Chest x-ray 05/27/2020  Lower extremity Dopplers pending  2D echo pending  Antimicrobials:   IV remdesivir 05/28/2020>>>> 06/02/2020   Subjective: C/o right sided chest pain which he states has improved since admission.  Shortness of breath improving.  Denies any productive cough during the hospitalization.  Overall starting to feel better than he did on admission.  Objective: Vitals:   05/28/20 1630 05/28/20 2018 05/29/20 0554 05/29/20 0745  BP: (!) 168/93 (!) 152/89 131/85 (!) 162/106  Pulse: 71 64 68 83  Resp: 20 18 16 16   Temp: 98.1 F (36.7 C) 97.6 F (36.4 C) 97.9 F (36.6 C) 98.1 F (36.7 C)  TempSrc: Oral Oral Oral Oral  SpO2: 98% 97% 98% 97%  Weight:      Height:        Intake/Output Summary (Last 24 hours) at 05/29/2020 1049 Last data filed at 05/28/2020 1500 Gross per 24 hour  Intake 219.36 ml  Output --  Net 219.36 ml   Filed Weights   05/27/20 1922  Weight: 108.9 kg    Examination:  General exam: Appears calm and comfortable  Respiratory system: Decreased BS in bases. Normal respiratory effort.  Cardiovascular system: S1 & S2 heard, RRR. No JVD, murmurs, rubs, gallops or clicks. No pedal edema. Gastrointestinal system: Abdomen is nondistended, soft and nontender. No organomegaly or masses felt. Normal bowel sounds heard. Central nervous system: Alert and oriented. No focal neurological deficits. Extremities: Symmetric 5 x 5 power. Skin: No rashes, lesions or ulcers Psychiatry: Judgement and insight appear normal. Mood & affect appropriate.     Data Reviewed: I have personally reviewed following labs and imaging studies  CBC: Recent Labs  Lab 05/27/20 1925 05/28/20 1200 05/29/20 0616  WBC 10.9* 9.2 10.3  NEUTROABS  --   --  8.0*  HGB  13.9 13.9 13.2  HCT 41.7 41.9 39.5  MCV 90.3 90.5 91.4  PLT 356 366 335    Basic Metabolic Panel: Recent Labs  Lab 05/27/20 1925 05/29/20 0616  NA 138 138  K 3.9 3.8  CL 102 107  CO2 26 24  GLUCOSE 139* 114*  BUN 7 14  CREATININE 0.79 0.81  CALCIUM 9.0 8.5*  MG  --  2.3  PHOS  --  3.4    GFR: Estimated Creatinine Clearance: 130.1 mL/min (by C-G formula based on SCr of 0.81 mg/dL).  Liver Function Tests: Recent Labs  Lab 05/29/20 0616  AST 20  ALT 33  ALKPHOS 88  BILITOT 0.7  PROT 6.7  ALBUMIN 3.2*    CBG: No results for input(s): GLUCAP in the last 168 hours.   Recent Results (from  the past 240 hour(s))  SARS Coronavirus 2 by RT PCR (hospital order, performed in [facility] hospital lab) Nasopharyngeal Nasopharyngeal Swab     Status: Abnormal   Collection Time: 05/28/20 12:07 AM   Specimen: Nasopharyngeal Swab  Result Value Ref Range Status   SARS Coronavirus 2 POSITIVE (A) NEGATIVE Final    Comment: RESULT CALLED TO, READ BACK BY AND VERIFIED WITH: LISA Mariadelcarmen Corella RN [id number] 05/28/20 HNM (NOTE) SARS-CoV-2 target nucleic acids are DETECTED  SARS-CoV-2 RNA is generally detectable in upper respiratory specimens  during the acute phase of infection.  Positive results are indicative  of the presence of the identified virus, but do not rule out bacterial infection or co-infection with other pathogens not detected by the test.  Clinical correlation with patient history and  other diagnostic information is necessary to determine patient infection status.  The expected result is negative.  Fact Sheet for Patients:   BoilerBrush.com.cy   Fact Sheet for Healthcare Providers:   https://pope.com/    This test is not yet approved or cleared by the [place] FDA and  has been authorized for detection and/or diagnosis of SARS-CoV-2 by FDA under an Emergency Use Authorization (EUA).  This EUA will remain in effect  (meaning this test  can be used) for the duration of  the COVID-19 declaration under Section 564(b)(1) of the Act, 21 U.S.C. section 360-bbb-3(b)(1), unless the authorization is terminated or revoked sooner.  Performed at [facility], [address], [place], [place] [address]          Radiology Studies: DG Chest 2 View  Result Date: 05/27/2020 CLINICAL DATA:  Chest pain EXAM: CHEST - 2 VIEW COMPARISON:  05/18/2020 FINDINGS: Patchy airspace disease in the lingula has developed in the interval. No effusion. Right lung clear. Mild pulmonary hyperinflation. Heart size and vascularity normal. IMPRESSION: Interval development of lingular infiltrate.  Possible pneumonia. Electronically Signed   By: Marlan Palau M.D.   On: 05/27/2020 19:49   CT Angio Chest PE W/Cm &/Or Wo Cm  Result Date: 05/28/2020 CLINICAL DATA:  Right-sided chest pain EXAM: CT ANGIOGRAPHY CHEST WITH CONTRAST TECHNIQUE: Multidetector CT imaging of the chest was performed using the standard protocol during bolus administration of intravenous contrast. Multiplanar CT image reconstructions and MIPs were obtained to evaluate the vascular anatomy. CONTRAST:  58mL OMNIPAQUE IOHEXOL 350 MG/ML SOLN COMPARISON:  None. FINDINGS: Cardiovascular: Normal heart size. No right heart dilatation. Mild coronary calcification. Positive for pulmonary artery filling defects seen at the right middle lobar artery, posterior segment right upper lobe, subsegmental posterior basal segment left lower lobe, and anterior basal segment right lower lobe. Mediastinum/Nodes:   Negative for adenopathy or mass. Lungs/Pleura: Geographic area of ground-glass opacity in the right middle lobe, consistent with ischemia/infarct. Mild ischemic changes may also be present in the right lower lobe. Atelectasis on both sides. History of COVID, no typical ground-glass opacities. No pleural effusion or failure. 7 mm right middle lobe pulmonary nodule. Upper Abdomen: No  acute finding in the arterial phase. A cystic density is seen in the posterior right liver with water densitometry on coronal reformats. Musculoskeletal: No acute or aggressive finding. Review of the MIP images confirms the above findings. Critical Value/emergent results were called by telephone at the time of interpretation on 05/28/2020 at 5:02 am to provider JADE SUNG , who verbally acknowledged these results. IMPRESSION: 1. Multifocal acute pulmonary emboli from lobar to subsegmental in size with pulmonary infarct at the right middle lobe. 2. 7 mm  right middle lobe pulmonary nodule. Non-contrast chest CT at 6-12 months is recommended. If the nodule is stable at time of repeat CT, then future CT at 18-24 months (from today's scan) is considered optional for low-risk patients, but is recommended for high-risk patients. This recommendation follows the consensus statement: Guidelines for Management of Incidental Pulmonary Nodules Detected on CT Images: From the Fleischner Society 2017; Radiology 2017; 284:228-243. 3. Coronary atherosclerosis. Electronically Signed   By: Marnee Spring M.D.   On: 05/28/2020 05:03        Scheduled Meds: . albuterol  2 puff Inhalation Q6H  . vitamin C  500 mg Oral Daily  . guaiFENesin  1,200 mg Oral BID  . methylPREDNISolone (SOLU-MEDROL) injection  60 mg Intravenous Q12H  . sodium chloride flush  3 mL Intravenous Q12H  . zinc sulfate  220 mg Oral Daily   Continuous Infusions: . sodium chloride    . heparin 1,850 Units/hr (05/29/20 0733)  . remdesivir 100 mg in NS 100 mL       LOS: 1 day    Time spent: 40 minutes    Ramiro Harvest, MD [facility]   To contact the attending provider between 7A-7P or the covering provider during after hours 7P-7A, please log into the web site [email or web address] and access using universal [place] password for that web site. If you do not have the password, please call the hospital operator.  05/29/2020, 10:49 AM

## 2020-05-29 NOTE — Progress Notes (Signed)
ANTICOAGULATION CONSULT NOTE - Follow up Consult  Pharmacy Consult for Heparin Indication: pulmonary embolus  Patient Measurements: Height: 6\' 6"  (198.1 cm) Weight: 108.9 kg (240 lb) IBW/kg (Calculated) : 91.4 HEPARIN DW (KG): 108.9  Labs: Recent Labs    05/27/20 1925 05/27/20 1925 05/27/20 2346 05/28/20 0539 05/28/20 1200 05/28/20 1316 05/28/20 2043 05/29/20 0616 05/29/20 1519  HGB 13.9   < >  --   --  13.9  --   --  13.2  --   HCT 41.7  --   --   --  41.9  --   --  39.5  --   PLT 356  --   --   --  366  --   --  335  --   APTT  --   --   --  27  --   --   --   --   --   LABPROT  --   --   --  13.1  --   --   --   --   --   INR  --   --   --  1.0  --   --   --   --   --   HEPARINUNFRC  --   --   --   --   --    < > 0.37 0.24* 0.26*  CREATININE 0.79  --   --   --   --   --   --  0.81  --   TROPONINIHS 7  --  5  --   --   --   --   --   --    < > = values in this interval not displayed.    Estimated Creatinine Clearance: 130.1 mL/min (by C-G formula based on SCr of 0.81 mg/dL).  Assessment: 59 yo male found to have PE. Baseline aPTT and INR within normal limits. Pharmacy consulted to initiate heparin infusion for PE.  No anticoagulants PTA.   Heparin Course: --Heparin 6000 units bolus x 1 then infusion at 1750 units/hr --0904 @ 0616 HL 0.24, SUBtherapeutic.  CBC stable.  Will increase heparin drip to 1850 units/hr   Goal of Therapy:  Heparin level 0.3-0.7 units/ml Monitor platelets by anticoagulation protocol: Yes   Plan:  --0904 @ 1519 HL 0.26, SUBtherapeutic. Will order heparin 1500 unit IV bolus x 1 and increase heparin drip to 2000 units/hr  --Re-check HL 6 hours after rate change --CBC daily per protocol  Pharmacy will continue to follow.   58 05/29/2020,6:10 PM

## 2020-05-29 NOTE — Progress Notes (Signed)
ANTICOAGULATION CONSULT NOTE - Follow up Consult  Pharmacy Consult for Heparin Indication: pulmonary embolus  No Known Allergies  Patient Measurements: Height: 6\' 6"  (198.1 cm) Weight: 108.9 kg (240 lb) IBW/kg (Calculated) : 91.4 HEPARIN DW (KG): 108.9  Vital Signs: Temp: 97.9 F (36.6 C) (09/04 0554) Temp Source: Oral (09/04 0554) BP: 131/85 (09/04 0554) Pulse Rate: 68 (09/04 0554)  Labs: Recent Labs    05/27/20 1925 05/27/20 1925 05/27/20 2346 05/28/20 0539 05/28/20 1200 05/28/20 1316 05/28/20 2043 05/29/20 0616  HGB 13.9   < >  --   --  13.9  --   --  13.2  HCT 41.7  --   --   --  41.9  --   --  39.5  PLT 356  --   --   --  366  --   --  335  APTT  --   --   --  27  --   --   --   --   LABPROT  --   --   --  13.1  --   --   --   --   INR  --   --   --  1.0  --   --   --   --   HEPARINUNFRC  --   --   --   --   --  0.50 0.37 0.24*  CREATININE 0.79  --   --   --   --   --   --   --   TROPONINIHS 7  --  5  --   --   --   --   --    < > = values in this interval not displayed.    Estimated Creatinine Clearance: 131.7 mL/min (by C-G formula based on SCr of 0.79 mg/dL).  Assessment: 59 yo male found to have PE. Baseline labs ordered.  Heparin for PE.  No anticoagulants PTA.   Heparin Course: Heparin 6000 units bolus x 1 then infusion at 1750 units/hr   Goal of Therapy:  Heparin level 0.3-0.7 units/ml Monitor platelets by anticoagulation protocol: Yes   Plan:  0904 @ 0616 HL 0.24, SUBtherapeutic.  CBC stable.  Will increase heparin drip to 1850 units/hr and recheck HL ~ 6 hours after rate increased CBC in AM  Pharmacy will continue to follow.   59 A 05/29/2020,7:08 AM

## 2020-05-30 ENCOUNTER — Inpatient Hospital Stay (HOSPITAL_COMMUNITY)
Admit: 2020-05-30 | Discharge: 2020-05-30 | Disposition: A | Discharge status: Home / Self Care | Payer: HRSA Program | Attending: Internal Medicine | Admitting: Internal Medicine

## 2020-05-30 DIAGNOSIS — I5031 Acute diastolic (congestive) heart failure: Secondary | ICD-10-CM

## 2020-05-30 DIAGNOSIS — I10 Essential (primary) hypertension: Secondary | ICD-10-CM | POA: Diagnosis present

## 2020-05-30 DIAGNOSIS — K5900 Constipation, unspecified: Secondary | ICD-10-CM | POA: Diagnosis present

## 2020-05-30 LAB — ECHOCARDIOGRAM COMPLETE
AR max vel: 2.12 cm2
AV Area VTI: 2.29 cm2
AV Area mean vel: 2.33 cm2
AV Mean grad: 8 mmHg
AV Peak grad: 11.8 mmHg
Ao pk vel: 1.72 m/s
Area-P 1/2: 3.74 cm2
S' Lateral: 3.32 cm

## 2020-05-30 LAB — CBC WITH DIFFERENTIAL/PLATELET
Abs Immature Granulocytes: 0.09 10*3/uL — ABNORMAL HIGH (ref 0.00–0.07)
Basophils Absolute: 0 10*3/uL (ref 0.0–0.1)
Basophils Relative: 0 %
Eosinophils Absolute: 0 10*3/uL (ref 0.0–0.5)
Eosinophils Relative: 0 %
HCT: 38.9 % — ABNORMAL LOW (ref 39.0–52.0)
Hemoglobin: 13.1 g/dL (ref 13.0–17.0)
Immature Granulocytes: 1 %
Lymphocytes Relative: 6 %
Lymphs Abs: 0.8 10*3/uL (ref 0.7–4.0)
MCH: 30.3 pg (ref 26.0–34.0)
MCHC: 33.7 g/dL (ref 30.0–36.0)
MCV: 90 fL (ref 80.0–100.0)
Monocytes Absolute: 0.5 10*3/uL (ref 0.1–1.0)
Monocytes Relative: 4 %
Neutro Abs: 12.1 10*3/uL — ABNORMAL HIGH (ref 1.7–7.7)
Neutrophils Relative %: 89 %
Platelets: 451 10*3/uL — ABNORMAL HIGH (ref 150–400)
RBC: 4.32 MIL/uL (ref 4.22–5.81)
RDW: 12.9 % (ref 11.5–15.5)
WBC: 13.4 10*3/uL — ABNORMAL HIGH (ref 4.0–10.5)
nRBC: 0 % (ref 0.0–0.2)

## 2020-05-30 LAB — COMPREHENSIVE METABOLIC PANEL
ALT: 39 U/L (ref 0–44)
AST: 27 U/L (ref 15–41)
Albumin: 3.2 g/dL — ABNORMAL LOW (ref 3.5–5.0)
Alkaline Phosphatase: 97 U/L (ref 38–126)
Anion gap: 9 (ref 5–15)
BUN: 14 mg/dL (ref 6–20)
CO2: 23 mmol/L (ref 22–32)
Calcium: 9 mg/dL (ref 8.9–10.3)
Chloride: 107 mmol/L (ref 98–111)
Creatinine, Ser: 0.7 mg/dL (ref 0.61–1.24)
GFR calc Af Amer: >60 mL/min (ref >60)
GFR calc non Af Amer: >60 mL/min (ref >60)
Glucose, Bld: 141 mg/dL — ABNORMAL HIGH (ref 70–99)
Potassium: 4.1 mmol/L (ref 3.5–5.1)
Sodium: 139 mmol/L (ref 135–145)
Total Bilirubin: 0.8 mg/dL (ref 0.3–1.2)
Total Protein: 6.7 g/dL (ref 6.5–8.1)

## 2020-05-30 LAB — FERRITIN: Ferritin: 206 ng/mL (ref 24–336)

## 2020-05-30 LAB — MAGNESIUM: Magnesium: 2.2 mg/dL (ref 1.7–2.4)

## 2020-05-30 LAB — PHOSPHORUS: Phosphorus: 3.9 mg/dL (ref 2.5–4.6)

## 2020-05-30 LAB — C-REACTIVE PROTEIN: CRP: 5.7 mg/dL — ABNORMAL HIGH (ref <1.0)

## 2020-05-30 LAB — HEPARIN LEVEL (UNFRACTIONATED)
Heparin Unfractionated: 0.47 IU/mL (ref 0.30–0.70)
Heparin Unfractionated: 0.41 IU/mL (ref 0.30–0.70)

## 2020-05-30 LAB — FIBRIN DERIVATIVES D-DIMER (ARMC ONLY): Fibrin derivatives D-dimer (ARMC): 1778.05 ng/mL (FEU) — ABNORMAL HIGH (ref 0.00–499.00)

## 2020-05-30 MED ORDER — AMLODIPINE BESYLATE 5 MG PO TABS
5.0000 mg | ORAL_TABLET | Freq: Every day | ORAL | Status: DC
  Administered 2020-05-30 – 2020-06-01 (×3): 5 mg via ORAL
  Filled 2020-05-30 (×3): qty 1

## 2020-05-30 MED ORDER — HYDRALAZINE HCL 20 MG/ML IJ SOLN: 10.0000 mg | Freq: Four times a day (QID) | INTRAMUSCULAR | Status: DC | PRN

## 2020-05-30 MED ORDER — POLYETHYLENE GLYCOL 3350 17 G PO PACK: 17.0000 g | PACK | Freq: Every day | ORAL | Status: DC | PRN

## 2020-05-30 MED ORDER — SORBITOL 70 % SOLN
30.0000 mL | Status: AC
  Administered 2020-05-30: 11:00:00 30 mL via ORAL
  Filled 2020-05-30 (×2): qty 30

## 2020-05-30 MED ORDER — POLYETHYLENE GLYCOL 3350 17 G PO PACK
17.0000 g | PACK | Freq: Two times a day (BID) | ORAL | Status: DC
  Administered 2020-05-30: 17 g via ORAL
  Filled 2020-05-30: qty 1

## 2020-05-30 NOTE — Progress Notes (Signed)
ANTICOAGULATION CONSULT NOTE  Pharmacy Consult for Heparin Indication: pulmonary embolus  Patient Measurements: Height: 6\' 6"  (198.1 cm) Weight: 108.9 kg (240 lb) IBW/kg (Calculated) : 91.4 HEPARIN DW (KG): 108.9  Labs: Recent Labs    05/27/20 1925 05/27/20 1925 05/27/20 2346 05/28/20 0539 05/28/20 1200 05/28/20 1316 05/29/20 0616 05/29/20 1519 05/30/20 0100  HGB 13.9   < >  --   --  13.9  --  13.2  --   --   HCT 41.7  --   --   --  41.9  --  39.5  --   --   PLT 356  --   --   --  366  --  335  --   --   APTT  --   --   --  27  --   --   --   --   --   LABPROT  --   --   --  13.1  --   --   --   --   --   INR  --   --   --  1.0  --   --   --   --   --   HEPARINUNFRC  --   --   --   --   --    < > 0.24* 0.26* 0.47  CREATININE 0.79  --   --   --   --   --  0.81  --   --   TROPONINIHS 7  --  5  --   --   --   --   --   --    < > = values in this interval not displayed.    Estimated Creatinine Clearance: 130.1 mL/min (by C-G formula based on SCr of 0.81 mg/dL).  Assessment: 59 yo male found to have PE. Baseline aPTT and INR within normal limits. Pharmacy consulted to initiate heparin infusion for PE.  No anticoagulants PTA. H&H stable and wnl, platelets are trending up  Goal of Therapy:  Heparin level 0.3-0.7 units/ml Monitor platelets by anticoagulation protocol: Yes   Plan:   Heparin level therapeutic: 2nd consecutive therapeutic level  continue heparin drip at 2000 units/hr   Re-check next heparin level in am 09/06  CBC daily per protocol  Pharmacy will continue to follow.   11/06 05/30/2020,6:59 AM

## 2020-05-30 NOTE — Progress Notes (Signed)
PROGRESS NOTE    Keith Johnston  VEL:[id number] DOB: 23-May-1961 DOA: 05/28/2020 PCP: Patient, No Pcp Per    Chief Complaint  Patient presents with   Chest Pain    Brief Narrative:  HPI per Dr. Blenda Peals Keith Johnston is a 59 y.o. male with no significant past medical history who presents to the emergency room for evaluation of right-sided chest pain.  Patient was seen at [facility] last week with similar symptoms.  He ruled out for an acute coronary syndrome during that ER visit and was advised to follow-up with cardiology as an outpatient.  Chest pain is rated 6 x 10 in intensity at its worst and is nonradiating.  It is aggravated by coughing.  Chest pain is also associated with lightheadedness and a productive cough.  Patient denies having any fever, no shortness of breath, no abdominal pain, no nausea, no vomiting or diarrhea. He is unvaccinated against COVID-19 and denies having any sick contacts. Labs show sodium 138, potassium 3.9, chloride 102, bicarb 26, BUN 7, creatinine 0.79, BNP 56.4, troponin V, white count 10.9, hemoglobin 13.9, hematocrit 41.7, fibrinogen 582, fibrin degradation 2680, His SARS coronavirus 2 PCR test is positive. Chest x-ray reviewed by me shows haziness in the right lower lung field. CT angiogram shows multifocal acute pulmonary emboli from lobar to subsegmental in size with pulmonary infarct at the right middle lobe. 7 mm right middle lobe pulmonary nodule.  Noncontrast chest CT at 6 to 12 months is recommended. Twelve-lead EKG reviewed by me shows sinus tachycardia   ED Course: Patient is a 59 year old male who presented to the ER for evaluation of right sided chest pain.  Patient had been evaluated a week following for chest pain and ruled out for an acute coronary syndrome.  Chest x-ray showed a right middle lobe infiltrate and a CT angiogram confirmed acute pulmonary embolism with pulmonary infarct.  Patient tested positive for the COVID-19 virus and  is unvaccinated.  He was started on a heparin drip in the ER and received remdesivir and Decadron.  He will be admitted to the hospital for further evaluation.   Assessment & Plan:   Principal Problem:   Pneumonia due to COVID-19 virus Active Problems:   Acute pulmonary embolism (HCC)   Nodule of middle lobe of right lung   Essential hypertension   Constipation  1 pneumonia due to COVID-19 virus Presented to the ED with right-sided chest pain ongoing x1 week, ACS ruled out, chest x-ray was concerning for right middle lobe infiltrate.  CT chest done concerning for acute PE and pulmonary infarct.  Patient unvaccinated and tested positive for COVID-19 virus.  CRP elevated yesterday at 12.3 however lab today pending.  Fibrin derivatives seems to slowly be trending down.  Procalcitonin negative.  Patient currently 94 to 96% on room air.  Follow inflammatory markers.  Continue IV remdesivir, IV Solu-Medrol, Mucinex, scheduled albuterol MDI, zinc, vitamin C..  Supportive care.  2.  Acute right-sided pulmonary emboli Questionable etiology.  Patient does state mother with a history of PEs.  Patient denies any recent long car rides or travel, no recent surgeries.  Patient already started on anticoagulation with IV heparin on admission and as such unable to obtain a hypercoagulable panel at this time.  2D echo pending.  Lower extremity Dopplers negative for DVT.  Continue IV heparin for another 24 hours and likely transition to oral NOAC tomorrow.  Will need ambulatory sats.  Supportive care.  3.  Nodule of middle lobe  right lung CT angiogram chest with 7 mm right middle lobe pulmonary nodule.  Will need repeat CT scans in 6 to 12 months for follow-up.  May need outpatient referral to pulmonary however will defer to PCP. Follow.  4.  Hypertension Patient denies any history of hypertension however has not seen a PCP in several years.  We will start Norvasc 5 mg daily and uptitrate as needed for better  blood pressure control.  5.  Constipation Per RN patient complaining of constipation.  Sorbitol p.o. every 3 hours x2 doses.  Patient currently on Colace twice daily.  MiraLAX as needed.    DVT prophylaxis: Heparin Code Status: Full Family Communication: Updated patient.  No family at bedside. Disposition:   Status is: Inpatient    Dispo: The patient is from: Home              Anticipated d/c is to: Home              Anticipated d/c date is: 1 to 2 days.               Patient currently admitted with acute right-sided PE, on IV heparin, Not stable for discharge.       Consultants:   None  Procedures:   CT angiogram chest 05/28/2020  Chest x-ray 05/27/2020  Lower extremity Dopplers 05/29/2020  2D echo 05/30/2020  Antimicrobials:   IV remdesivir 05/28/2020>>>> 06/02/2020   Subjective: Patient states improvement with right-sided pleuritic chest pain.  Shortness of breath improved.  Feeling better.  Wondering why his blood pressure is elevated.  Stated does not feel significantly constipated however has not had a bowel movement in 2 to 3 days.   Objective: Vitals:   05/30/20 0513 05/30/20 0828 05/30/20 1230 05/30/20 1233  BP: (!) 145/91 (!) 156/77 (!) 155/76 136/78  Pulse: 72 (!) 101 90 88  Resp: 16 20 (!) 21 (!) 21  Temp: 98 F (36.7 C) 97.8 F (36.6 C) 98.1 F (36.7 C) 97.9 F (36.6 C)  TempSrc: Oral Oral Oral Oral  SpO2: 96% 96% 95% 94%  Weight:      Height:        Intake/Output Summary (Last 24 hours) at 05/30/2020 1524 Last data filed at 05/30/2020 1348 Gross per 24 hour  Intake 1273.93 ml  Output --  Net 1273.93 ml   Filed Weights   05/27/20 1922  Weight: 108.9 kg    Examination:  General exam: NAD Respiratory system: Decreased breath sounds in the bases.  No wheezing.  No crackles.  Normal respiratory effort.  Cardiovascular system: Regular rate rhythm no murmurs rubs or gallops.  No JVD.  No lower extremity edema.  Gastrointestinal system: Abdomen  is soft, nontender, nondistended, positive bowel sounds.  No rebound.  No guarding.  Central nervous system: Alert and oriented. No focal neurological deficits. Extremities: Symmetric 5 x 5 power. Skin: No rashes, lesions or ulcers Psychiatry: Judgement and insight appear normal. Mood & affect appropriate.     Data Reviewed: I have personally reviewed following labs and imaging studies  CBC: Recent Labs  Lab 05/27/20 1925 05/28/20 1200 05/29/20 0616 05/30/20 0723  WBC 10.9* 9.2 10.3 13.4*  NEUTROABS  --   --  8.0* 12.1*  HGB 13.9 13.9 13.2 13.1  HCT 41.7 41.9 39.5 38.9*  MCV 90.3 90.5 91.4 90.0  PLT 356 366 335 451*    Basic Metabolic Panel: Recent Labs  Lab 05/27/20 1925 05/29/20 0616 05/30/20 0723  NA 138 138 139  K 3.9 3.8 4.1  CL 102 107 107  CO2 26 24 23   GLUCOSE 139* 114* 141*  BUN 7 14 14   CREATININE 0.79 0.81 0.70  CALCIUM 9.0 8.5* 9.0  MG  --  2.3 2.2  PHOS  --  3.4 3.9    GFR: Estimated Creatinine Clearance: 131.7 mL/min (by C-G formula based on SCr of 0.7 mg/dL).  Liver Function Tests: Recent Labs  Lab 05/29/20 0616 05/30/20 0723  AST 20 27  ALT 33 39  ALKPHOS 88 97  BILITOT 0.7 0.8  PROT 6.7 6.7  ALBUMIN 3.2* 3.2*    CBG: No results for input(s): GLUCAP in the last 168 hours.   Recent Results (from the past 240 hour(s))  SARS Coronavirus 2 by RT PCR (hospital order, performed in [facility] hospital lab) Nasopharyngeal Nasopharyngeal Swab     Status: Abnormal   Collection Time: 05/28/20 12:07 AM   Specimen: Nasopharyngeal Swab  Result Value Ref Range Status   SARS Coronavirus 2 POSITIVE (A) NEGATIVE Final    Comment: RESULT CALLED TO, READ BACK BY AND VERIFIED WITH: LISA Jenalee Trevizo RN [id number] 05/28/20 HNM (NOTE) SARS-CoV-2 target nucleic acids are DETECTED  SARS-CoV-2 RNA is generally detectable in upper respiratory specimens  during the acute phase of infection.  Positive results are indicative  of the presence of the identified virus,  but do not rule out bacterial infection or co-infection with other pathogens not detected by the test.  Clinical correlation with patient history and  other diagnostic information is necessary to determine patient infection status.  The expected result is negative.  Fact Sheet for Patients:   07/28/20   Fact Sheet for Healthcare Providers:   07/28/20    This test is not yet approved or cleared by the [employer] FDA and  has been authorized for detection and/or diagnosis of SARS-CoV-2 by FDA under an Emergency Use Authorization (EUA).  This EUA will remain in effect (meaning this test  can be used) for the duration of  the COVID-19 declaration under Section 564(b)(1) of the Act, 21 U.S.C. section 360-bbb-3(b)(1), unless the authorization is terminated or revoked sooner.  Performed at [facility], [address], [place], [address] [place]          Radiology Studies: [place] Venous Img Lower Bilateral (DVT)  Result Date: 05/29/2020 CLINICAL DATA:  Pulmonary emboli, evaluate for residual DVT burden. EXAM: BILATERAL LOWER EXTREMITY VENOUS DOPPLER ULTRASOUND TECHNIQUE: Gray-scale sonography with graded compression, as well as color Doppler and duplex ultrasound were performed to evaluate the lower extremity deep venous systems from the level of the common femoral vein and including the common femoral, femoral, profunda femoral, popliteal and calf veins including the posterior tibial, peroneal and gastrocnemius veins when visible. The superficial great saphenous vein was also interrogated. Spectral Doppler was utilized to evaluate flow at rest and with distal augmentation maneuvers in the common femoral, femoral and popliteal veins. COMPARISON:  None. FINDINGS: RIGHT LOWER EXTREMITY Common Femoral Vein: No evidence of thrombus. Normal compressibility, respiratory phasicity and response to augmentation. Saphenofemoral  Junction: No evidence of thrombus. Normal compressibility and flow on color Doppler imaging. Profunda Femoral Vein: No evidence of thrombus. Normal compressibility and flow on color Doppler imaging. Femoral Vein: No evidence of thrombus. Normal compressibility, respiratory phasicity and response to augmentation. Popliteal Vein: No evidence of thrombus. Normal compressibility, respiratory phasicity and response to augmentation. Calf Veins: No evidence of thrombus. Normal compressibility and flow on color Doppler imaging. Superficial Great Saphenous Vein:  No evidence of thrombus. Normal compressibility. Venous Reflux:  None. Other Findings:  None. LEFT LOWER EXTREMITY Common Femoral Vein: No evidence of thrombus. Normal compressibility, respiratory phasicity and response to augmentation. Saphenofemoral Junction: No evidence of thrombus. Normal compressibility and flow on color Doppler imaging. Profunda Femoral Vein: No evidence of thrombus. Normal compressibility and flow on color Doppler imaging. Femoral Vein: No evidence of thrombus. Normal compressibility, respiratory phasicity and response to augmentation. Popliteal Vein: No evidence of thrombus. Normal compressibility, respiratory phasicity and response to augmentation. Calf Veins: No evidence of thrombus. Normal compressibility and flow on color Doppler imaging. Superficial Great Saphenous Vein: No evidence of thrombus. Normal compressibility. Venous Reflux:  None. Other Findings:  None. IMPRESSION: No evidence of deep venous thrombosis in either lower extremity. Electronically Signed   By: Malachy MoanHeath  McCullough M.D.   On: 05/29/2020 13:29   ECHOCARDIOGRAM COMPLETE  Result Date: 05/30/2020    ECHOCARDIOGRAM REPORT   Patient Name:   Laurence SlateHOMAS Muzzy Date of Exam: 05/30/2020 Medical Rec #:  [id number]     Height:       78.0 in Accession #:    [id number]    Weight:       240.0 lb Date of Birth:  1960-12-29     BSA:          2.439 m Patient Age:    58 years      BP:            145/91 mmHg Patient Gender: M             HR:           72 bpm. Exam Location:  [facility] Procedure: 2D Echo Indications:     Acute Diastolic Heart Failure  History:         Patient has no prior history of Echocardiogram examinations.                  Risk Factors:Hypertension.  Sonographer:     L Thornton-Maynard Referring Phys:  [id number] TOCHUKWU AGBATA Diagnosing Phys: Debbe OdeaBrian Agbor-Etang MD IMPRESSIONS  1. Left ventricular ejection fraction, by estimation, is 60 to 65%. The left ventricle has normal function. The left ventricle has no regional wall motion abnormalities. Left ventricular diastolic parameters are consistent with Grade I diastolic dysfunction (impaired relaxation).  2. Right ventricular systolic function is normal. The right ventricular size is normal.  3. The mitral valve is normal in structure. No evidence of mitral valve regurgitation. No evidence of mitral stenosis.  4. The aortic valve is normal in structure. Aortic valve regurgitation is not visualized. No aortic stenosis is present.  5. The inferior vena cava is normal in size with greater than 50% respiratory variability, suggesting right atrial pressure of 3 mmHg. FINDINGS  Left Ventricle: Left ventricular ejection fraction, by estimation, is 60 to 65%. The left ventricle has normal function. The left ventricle has no regional wall motion abnormalities. The left ventricular internal cavity size was normal in size. There is  no left ventricular hypertrophy. Left ventricular diastolic parameters are consistent with Grade I diastolic dysfunction (impaired relaxation). Right Ventricle: The right ventricular size is normal. No increase in right ventricular wall thickness. Right ventricular systolic function is normal. Left Atrium: Left atrial size was normal in size. Right Atrium: Right atrial size was normal in size. Pericardium: There is no evidence of pericardial effusion. Mitral Valve: The mitral valve is normal in structure. Normal mobility of  the mitral valve leaflets. No evidence of mitral valve  regurgitation. No evidence of mitral valve stenosis. Tricuspid Valve: The tricuspid valve is normal in structure. Tricuspid valve regurgitation is not demonstrated. No evidence of tricuspid stenosis. Aortic Valve: The aortic valve is normal in structure. Aortic valve regurgitation is not visualized. No aortic stenosis is present. Aortic valve mean gradient measures 8.0 mmHg. Aortic valve peak gradient measures 11.8 mmHg. Aortic valve area, by VTI measures 2.29 cm. Pulmonic Valve: The pulmonic valve was not well visualized. Pulmonic valve regurgitation is not visualized. No evidence of pulmonic stenosis. Aorta: The aortic root is normal in size and structure. Venous: The inferior vena cava is normal in size with greater than 50% respiratory variability, suggesting right atrial pressure of 3 mmHg. IAS/Shunts: No atrial level shunt detected by color flow Doppler.  LEFT VENTRICLE PLAX 2D LVIDd:         4.50 cm  Diastology LVIDs:         3.32 cm  LV e' lateral:   8.27 cm/s LV PW:         1.42 cm  LV E/e' lateral: 10.0 LV IVS:        1.22 cm  LV e' medial:    7.18 cm/s LVOT diam:     2.00 cm  LV E/e' medial:  11.5 LV SV:         76 LV SV Index:   31 LVOT Area:     3.14 cm  RIGHT VENTRICLE RV S prime:     16.60 cm/s TAPSE (M-mode): 3.0 cm LEFT ATRIUM             Index LA diam:        2.70 cm 1.11 cm/m LA Vol (A2C):   35.7 ml 14.63 ml/m LA Vol (A4C):   30.4 ml 12.46 ml/m LA Biplane Vol: 33.0 ml 13.53 ml/m  AORTIC VALVE                    PULMONIC VALVE AV Area (Vmax):    2.12 cm     PV Vmax:       1.41 m/s AV Area (Vmean):   2.33 cm     PV Peak grad:  8.0 mmHg AV Area (VTI):     2.29 cm AV Vmax:           172.00 cm/s AV Vmean:          132.000 cm/s AV VTI:            0.330 m AV Peak Grad:      11.8 mmHg AV Mean Grad:      8.0 mmHg LVOT Vmax:         116.00 cm/s LVOT Vmean:        97.900 cm/s LVOT VTI:          0.241 m LVOT/AV VTI ratio: 0.73 MITRAL VALVE MV  Area (PHT): 3.74 cm     SHUNTS MV Decel Time: 203 msec     Systemic VTI:  0.24 m MV E velocity: 82.90 cm/s   Systemic Diam: 2.00 cm MV A velocity: 100.00 cm/s MV E/A ratio:  0.83 Debbe Odea MD Electronically signed by Debbe Odea MD Signature Date/Time: 05/30/2020/12:48:41 PM    Final         Scheduled Meds:  albuterol  2 puff Inhalation Q6H   amLODipine  5 mg Oral Daily   vitamin C  500 mg Oral Daily   docusate sodium  100 mg Oral BID   guaiFENesin  1,200  mg Oral BID   methylPREDNISolone (SOLU-MEDROL) injection  60 mg Intravenous Q12H   sodium chloride flush  3 mL Intravenous Q12H   zinc sulfate  220 mg Oral Daily   Continuous Infusions:  sodium chloride Stopped (05/30/20 1117)   heparin 2,000 Units/hr (05/30/20 1348)   remdesivir 100 mg in NS 100 mL Stopped (05/30/20 0901)     LOS: 2 days    Time spent: 40 minutes    Ramiro Harvest, MD [facility]   To contact the attending provider between 7A-7P or the covering provider during after hours 7P-7A, please log into the web site [email or web address] and access using universal [place] password for that web site. If you do not have the password, please call the hospital operator.  05/30/2020, 3:24 PM

## 2020-05-30 NOTE — Progress Notes (Signed)
ANTICOAGULATION CONSULT NOTE - Follow up Consult  Pharmacy Consult for Heparin Indication: pulmonary embolus  Patient Measurements: Height: 6\' 6"  (198.1 cm) Weight: 108.9 kg (240 lb) IBW/kg (Calculated) : 91.4 HEPARIN DW (KG): 108.9  Labs: Recent Labs    05/27/20 1925 05/27/20 1925 05/27/20 2346 05/28/20 0539 05/28/20 1200 05/28/20 1316 05/29/20 0616 05/29/20 1519 05/30/20 0100  HGB 13.9   < >  --   --  13.9  --  13.2  --   --   HCT 41.7  --   --   --  41.9  --  39.5  --   --   PLT 356  --   --   --  366  --  335  --   --   APTT  --   --   --  27  --   --   --   --   --   LABPROT  --   --   --  13.1  --   --   --   --   --   INR  --   --   --  1.0  --   --   --   --   --   HEPARINUNFRC  --   --   --   --   --    < > 0.24* 0.26* 0.47  CREATININE 0.79  --   --   --   --   --  0.81  --   --   TROPONINIHS 7  --  5  --   --   --   --   --   --    < > = values in this interval not displayed.    Estimated Creatinine Clearance: 130.1 mL/min (by C-G formula based on SCr of 0.81 mg/dL).  Assessment: 59 yo male found to have PE. Baseline aPTT and INR within normal limits. Pharmacy consulted to initiate heparin infusion for PE.  No anticoagulants PTA.   Heparin Course: --Heparin 6000 units bolus x 1 then infusion at 1750 units/hr --0904 @ 0616 HL 0.24, SUBtherapeutic.  CBC stable.  Will increase heparin drip to 1850 units/hr  Goal of Therapy:  Heparin level 0.3-0.7 units/ml Monitor platelets by anticoagulation protocol: Yes   Plan:  --0905 @ 0100 HL 0.47, therapeutic x 1. Will continue heparin drip at 2000 units/hr  --Re-check HL 6 hours to confirm --CBC daily per protocol  Pharmacy will continue to follow.   58 A 05/30/2020,2:11 AM

## 2020-05-31 LAB — CBC WITH DIFFERENTIAL/PLATELET
Abs Immature Granulocytes: 0.1 10*3/uL — ABNORMAL HIGH (ref 0.00–0.07)
Basophils Absolute: 0 10*3/uL (ref 0.0–0.1)
Basophils Relative: 0 %
Eosinophils Absolute: 0 10*3/uL (ref 0.0–0.5)
Eosinophils Relative: 0 %
HCT: 39.6 % (ref 39.0–52.0)
Hemoglobin: 13.4 g/dL (ref 13.0–17.0)
Immature Granulocytes: 1 %
Lymphocytes Relative: 8 %
Lymphs Abs: 0.9 10*3/uL (ref 0.7–4.0)
MCH: 30.5 pg (ref 26.0–34.0)
MCHC: 33.8 g/dL (ref 30.0–36.0)
MCV: 90 fL (ref 80.0–100.0)
Monocytes Absolute: 0.5 10*3/uL (ref 0.1–1.0)
Monocytes Relative: 4 %
Neutro Abs: 9.5 10*3/uL — ABNORMAL HIGH (ref 1.7–7.7)
Neutrophils Relative %: 87 %
Platelets: 469 10*3/uL — ABNORMAL HIGH (ref 150–400)
RBC: 4.4 MIL/uL (ref 4.22–5.81)
RDW: 13.3 % (ref 11.5–15.5)
WBC: 11 10*3/uL — ABNORMAL HIGH (ref 4.0–10.5)
nRBC: 0 % (ref 0.0–0.2)

## 2020-05-31 LAB — COMPREHENSIVE METABOLIC PANEL
ALT: 82 U/L — ABNORMAL HIGH (ref 0–44)
AST: 67 U/L — ABNORMAL HIGH (ref 15–41)
Albumin: 3.1 g/dL — ABNORMAL LOW (ref 3.5–5.0)
Alkaline Phosphatase: 116 U/L (ref 38–126)
Anion gap: 9 (ref 5–15)
BUN: 15 mg/dL (ref 6–20)
CO2: 24 mmol/L (ref 22–32)
Calcium: 9.2 mg/dL (ref 8.9–10.3)
Chloride: 108 mmol/L (ref 98–111)
Creatinine, Ser: 0.8 mg/dL (ref 0.61–1.24)
GFR calc Af Amer: >60 mL/min (ref >60)
GFR calc non Af Amer: >60 mL/min (ref >60)
Glucose, Bld: 128 mg/dL — ABNORMAL HIGH (ref 70–99)
Potassium: 4.3 mmol/L (ref 3.5–5.1)
Sodium: 141 mmol/L (ref 135–145)
Total Bilirubin: 0.7 mg/dL (ref 0.3–1.2)
Total Protein: 6.6 g/dL (ref 6.5–8.1)

## 2020-05-31 LAB — FERRITIN: Ferritin: 217 ng/mL (ref 24–336)

## 2020-05-31 LAB — PHOSPHORUS: Phosphorus: 3.9 mg/dL (ref 2.5–4.6)

## 2020-05-31 LAB — C-REACTIVE PROTEIN: CRP: 2.5 mg/dL — ABNORMAL HIGH (ref <1.0)

## 2020-05-31 LAB — HEPARIN LEVEL (UNFRACTIONATED): Heparin Unfractionated: 0.4 IU/mL (ref 0.30–0.70)

## 2020-05-31 LAB — FIBRIN DERIVATIVES D-DIMER (ARMC ONLY): Fibrin derivatives D-dimer (ARMC): 1378.55 ng/mL (FEU) — ABNORMAL HIGH (ref 0.00–499.00)

## 2020-05-31 LAB — MAGNESIUM: Magnesium: 2.2 mg/dL (ref 1.7–2.4)

## 2020-05-31 MED ORDER — APIXABAN 5 MG PO TABS
10.0000 mg | ORAL_TABLET | Freq: Two times a day (BID) | ORAL | Status: DC
  Administered 2020-05-31 – 2020-06-01 (×3): 10 mg via ORAL
  Filled 2020-05-31 (×3): qty 2

## 2020-05-31 MED ORDER — APIXABAN 5 MG PO TABS: 5.0000 mg | ORAL_TABLET | Freq: Two times a day (BID) | ORAL | Status: DC

## 2020-05-31 NOTE — Progress Notes (Signed)
ANTICOAGULATION CONSULT NOTE  Pharmacy Consult for Heparin Indication: pulmonary embolus  Patient Measurements: Height: 6\' 6"  (198.1 cm) Weight: 108.9 kg (240 lb) IBW/kg (Calculated) : 91.4 HEPARIN DW (KG): 108.9  Labs: Recent Labs    05/28/20 1200 05/28/20 1316 05/29/20 0616 05/29/20 1519 05/30/20 0100 05/30/20 0723 05/31/20 0446  HGB 13.9   < > 13.2  --   --  13.1  --   HCT 41.9  --  39.5  --   --  38.9*  --   PLT 366  --  335  --   --  451*  --   HEPARINUNFRC  --    < > 0.24*   < > 0.47 0.41 0.40  CREATININE  --   --  0.81  --   --  0.70  --    < > = values in this interval not displayed.    Estimated Creatinine Clearance: 131.7 mL/min (by C-G formula based on SCr of 0.7 mg/dL).  Assessment: 59 yo male found to have PE. Baseline aPTT and INR within normal limits. Pharmacy consulted to initiate heparin infusion for PE.  No anticoagulants PTA. H&H stable and wnl, platelets are trending up  Goal of Therapy:  Heparin level 0.3-0.7 units/ml Monitor platelets by anticoagulation protocol: Yes   Plan:   Heparin level therapeutic: 2nd consecutive therapeutic level  continue heparin drip at 2000 units/hr   Re-check next heparin level in am 09/07  CBC daily per protocol  Pharmacy will continue to follow.   11/07 A 05/31/2020,6:11 AM

## 2020-05-31 NOTE — Progress Notes (Signed)
Patient had an uneventful night. Heparin IV at 20 maintained. Call light and working phone kept within reach. Will endorse.

## 2020-05-31 NOTE — Discharge Instructions (Signed)
Information on my medicine - ELIQUIS (apixaban)  This medication education was reviewed with me or my healthcare representative as part of my discharge preparation.    Why was Eliquis prescribed for you? Eliquis was prescribed to treat blood clots that may have been found in the veins of your legs (deep vein thrombosis) or in your lungs (pulmonary embolism) and to reduce the risk of them occurring again.  What do You need to know about Eliquis ? The starting dose is 10 mg (two 5 mg tablets) taken TWICE daily for the FIRST SEVEN (7) DAYS, then on 9/13  the dose is reduced to ONE 5 mg tablet taken TWICE daily.  Eliquis may be taken with or without food.   Try to take the dose about the same time in the morning and in the evening. If you have difficulty swallowing the tablet whole please discuss with your pharmacist how to take the medication safely.  Take Eliquis exactly as prescribed and DO NOT stop taking Eliquis without talking to the doctor who prescribed the medication.  Stopping may increase your risk of developing a new blood clot.  Refill your prescription before you run out.  After discharge, you should have regular check-up appointments with your healthcare provider that is prescribing your Eliquis.    What do you do if you miss a dose? If a dose of ELIQUIS is not taken at the scheduled time, take it as soon as possible on the same day and twice-daily administration should be resumed. The dose should not be doubled to make up for a missed dose.  Important Safety Information A possible side effect of Eliquis is bleeding. You should call your healthcare provider right away if you experience any of the following: ? Bleeding from an injury or your nose that does not stop. ? Unusual colored urine (red or dark brown) or unusual colored stools (red or black). ? Unusual bruising for unknown reasons. ? A serious fall or if you hit your head (even if there is no bleeding).  Some  medicines may interact with Eliquis and might increase your risk of bleeding or clotting while on Eliquis. To help avoid this, consult your healthcare provider or pharmacist prior to using any new prescription or non-prescription medications, including herbals, vitamins, non-steroidal anti-inflammatory drugs (NSAIDs) and supplements.  This website has more information on Eliquis (apixaban): http://[email or web address].com/eliquis/home

## 2020-05-31 NOTE — Evaluation (Signed)
Occupational Therapy Evaluation Patient Details Name: Keith Johnston MRN: [id number] DOB: 02-14-1961 Today's Date: 05/31/2020    History of Present Illness Pt is 59 y/o M who is current every day smoker who presented to ED for evaluation of right-sided chest pain.  Patient was seen at [facility] last week with similar symptoms. ED workup included: SARS coronavirus 2 PCR test is positive, CXR hazy in right lower lung field, CT angiogram shows multifocal acute pulmonary emboli (therapeutic on heparin x2 readings at time of evaluation).   Clinical Impression   Pt seen for OT evaluation this date in setting of acute hospitalization for COVID-19 and found to also have PE, now therapeutic on heparin. Pt reports living alone in two story home, being INDEP at baseline including working and driving. OT educates pt re: role of OT in acute setting. Pt with good understanding. Pt demos normal static and dynamic sitting and standing balance. In addition, pt demos good tolerance for performing all aspects of self care including UB/LB dressing (INDEP). Pt appears to be at his reported functional baseline. No further acute OT needs detected, nor is it anticipated that pt will require follow up upon discharge.     Follow Up Recommendations  No OT follow up    Equipment Recommendations  None recommended by OT    Recommendations for Other Services       Precautions / Restrictions Precautions Precautions: Fall Restrictions Weight Bearing Restrictions: No      Mobility Bed Mobility Overal bed mobility: Independent                Transfers Overall transfer level: Independent                    Balance Overall balance assessment: Independent                                         ADL either performed or assessed with clinical judgement   ADL Overall ADL's : Independent;At baseline                                             Vision  Baseline Vision/History: Wears glasses Wears Glasses: At all times Patient Visual Report: No change from baseline       Perception     Praxis      Pertinent Vitals/Pain Pain Assessment: No/denies pain     Hand Dominance     Extremity/Trunk Assessment Upper Extremity Assessment Upper Extremity Assessment: Overall WFL for tasks assessed   Lower Extremity Assessment Lower Extremity Assessment: Overall WFL for tasks assessed       Communication Communication Communication: No difficulties   Cognition Arousal/Alertness: Awake/alert Behavior During Therapy: WFL for tasks assessed/performed Overall Cognitive Status: Within Functional Limits for tasks assessed                                     General Comments       Exercises Other Exercises Other Exercises: OT facilitates ed re: role of OT. Pt with good understanding.   Shoulder Instructions      Home Living Family/patient expects to be discharged to:: Private residence Living Arrangements: Alone   Type of  Home: House       Home Layout: Two level Alternate Level Stairs-Number of Steps: 14 Alternate Level Stairs-Rails: Can reach both     Bathroom Toilet: Standard     Home Equipment: None          Prior Functioning/Environment Level of Independence: Independent        Comments: working and driving        OT Problem List: Decreased activity tolerance;Cardiopulmonary status limiting activity      OT Treatment/Interventions: Self-care/ADL training;Therapeutic activities    OT Goals(Current goals can be found in the care plan section) Acute Rehab OT Goals Patient Stated Goal: to go home OT Goal Formulation: All assessment and education complete, DC therapy  OT Frequency: Min 1X/week   Barriers to D/C:            Co-evaluation              AM-PAC OT "6 Clicks" Daily Activity     Outcome Measure Help from another person eating meals?: None Help from another person  taking care of personal grooming?: None Help from another person toileting, which includes using toliet, bedpan, or urinal?: None Help from another person bathing (including washing, rinsing, drying)?: None Help from another person to put on and taking off regular upper body clothing?: None Help from another person to put on and taking off regular lower body clothing?: None 6 Click Score: 24   End of Session Equipment Utilized During Treatment: Gait belt  Activity Tolerance: Patient tolerated treatment well Patient left: in bed;with call bell/phone within reach  OT Visit Diagnosis: Muscle weakness (generalized) (M62.81)                Time: 2423-5361 OT Time Calculation (min): 27 min Charges:  OT General Charges $OT Visit: 1 Visit OT Evaluation $OT Eval Low Complexity: 1 Low OT Treatments $Self Care/Home Management : 8-22 mins  Rejeana Brock, MS, OTR/L ascom [phone] 05/31/20, 10:15 AM

## 2020-05-31 NOTE — Progress Notes (Addendum)
ANTICOAGULATION CONSULT NOTE - Initial Consult  Pharmacy Consult for Apixaban  Indication: Pulmonary Embolism   No Known Allergies  Patient Measurements: Height: 6\' 6"  (198.1 cm) Weight: 108.9 kg (240 lb) IBW/kg (Calculated) : 91.4  Vital Signs: Temp: 98.1 F (36.7 C) (09/06 1146) Temp Source: Oral (09/06 0739) BP: 135/83 (09/06 1146) Pulse Rate: 108 (09/06 1146)  Labs: Recent Labs    05/29/20 0616 05/29/20 0616 05/29/20 1519 05/30/20 0100 05/30/20 0723 05/31/20 0446  HGB 13.2   < >  --   --  13.1 13.4  HCT 39.5  --   --   --  38.9* 39.6  PLT 335  --   --   --  451* 469*  HEPARINUNFRC 0.24*  --    < > 0.47 0.41 0.40  CREATININE 0.81  --   --   --  0.70 0.80   < > = values in this interval not displayed.    Estimated Creatinine Clearance: 131.7 mL/min (by C-G formula based on SCr of 0.8 mg/dL).   Assessment: 59 yo male found to have PE and started on heparin infusion. Pharmacy now consulted for apixaban dosing.   Plan:  Will start apixaban 10mg  BID x 7 days, followed apixban 5mg  BID.   Will monitor CBC every 3 days per protocol.  59, PharmD, BCPS Clinical Pharmacist 05/31/2020 12:12 PM

## 2020-05-31 NOTE — Progress Notes (Signed)
PT Cancellation Note  Patient Details Name: Keith Johnston MRN: [id number] DOB: Jan 15, 1961   Cancelled Treatment:    Reason Eval/Treat Not Completed: PT screened, no needs identified, will sign off (Pt reports he has been AMB ad lib, in room and around unit. Reports to feel good, has no complaints. OT evaluation corroborates these claims. PT will sign off.)  11:18 AM, 05/31/20 Rosamaria Lints, PT, DPT Physical Therapist - [facility]  [phone] (ASCOM)     [name] 05/31/2020, 11:17 AM

## 2020-05-31 NOTE — Progress Notes (Signed)
SATURATION QUALIFICATIONS: (This note is used to comply with regulatory documentation for home oxygen)  Patient Saturations on Room Air at Rest = 97%  Patient Saturations on Room Air while Ambulating = 94%  Patient Saturations on 0 Liters of oxygen while Ambulating = %  Please briefly explain why patient needs home oxygen: Home oxygen not indicated per MD. Sats > 90% on room air.

## 2020-05-31 NOTE — Progress Notes (Signed)
PROGRESS NOTE    Keith Johnston  IRS:[id number] DOB: 05/02/1961 DOA: 05/28/2020 PCP: Patient, No Pcp Per    Chief Complaint  Patient presents with  . Chest Pain    Brief Narrative:  HPI per Dr. Blenda Peals Keith Johnston is a 59 y.o. male with no significant past medical history who presents to the emergency room for evaluation of right-sided chest pain.  Patient was seen at [facility] last week with similar symptoms.  He ruled out for an acute coronary syndrome during that ER visit and was advised to follow-up with cardiology as an outpatient.  Chest pain is rated 6 x 10 in intensity at its worst and is nonradiating.  It is aggravated by coughing.  Chest pain is also associated with lightheadedness and a productive cough.  Patient denies having any fever, no shortness of breath, no abdominal pain, no nausea, no vomiting or diarrhea. He is unvaccinated against COVID-19 and denies having any sick contacts. Labs show sodium 138, potassium 3.9, chloride 102, bicarb 26, BUN 7, creatinine 0.79, BNP 56.4, troponin V, white count 10.9, hemoglobin 13.9, hematocrit 41.7, fibrinogen 582, fibrin degradation 2680, His SARS coronavirus 2 PCR test is positive. Chest x-ray reviewed by me shows haziness in the right lower lung field. CT angiogram shows multifocal acute pulmonary emboli from lobar to subsegmental in size with pulmonary infarct at the right middle lobe. 7 mm right middle lobe pulmonary nodule.  Noncontrast chest CT at 6 to 12 months is recommended. Twelve-lead EKG reviewed by me shows sinus tachycardia   ED Course: Patient is a 59 year old male who presented to the ER for evaluation of right sided chest pain.  Patient had been evaluated a week following for chest pain and ruled out for an acute coronary syndrome.  Chest x-ray showed a right middle lobe infiltrate and a CT angiogram confirmed acute pulmonary embolism with pulmonary infarct.  Patient tested positive for the COVID-19 virus and  is unvaccinated.  He was started on a heparin drip in the ER and received remdesivir and Decadron.  He will be admitted to the hospital for further evaluation.   Assessment & Plan:   Principal Problem:   Pneumonia due to COVID-19 virus Active Problems:   Acute pulmonary embolism (HCC)   Nodule of middle lobe of right lung   Essential hypertension   Constipation  1 pneumonia due to COVID-19 virus Presented to the ED with right-sided chest pain ongoing x1 week, ACS ruled out, chest x-ray was concerning for right middle lobe infiltrate.  CT chest done concerning for acute PE and pulmonary infarct.  Patient unvaccinated and tested positive for COVID-19 virus.  CRP down and currently at 2.5 from 5.7 from 12.3.  Fibrin derivatives at 1378 from 1778 from 1990.  Patient with PE.  Patient on full dose anticoagulation with IV heparin.  Patient currently with of 97% on room air.  Continue IV remdesivir, IV Solu-Medrol, Mucinex, scheduled albuterol MDI, zinc, vitamin C.  Supportive care.  2.  Acute right-sided pulmonary emboli Questionable etiology.  Patient does state mother with a history of PEs.  Patient denies any recent long car rides or travel, no recent surgeries.  Patient already started on anticoagulation with IV heparin on admission and as such unable to obtain a hypercoagulable panel at this time.  2D echo with normal EF, no right ventricular strain noted.  Lower extremity Dopplers negative for DVT.  We will transition from IV heparin to Eliquis.  Check ambulatory sats.  Supportive care.  3.  Nodule of middle lobe right lung CT angiogram chest with 7 mm right middle lobe pulmonary nodule.  Will need repeat CT scans in 6 to 12 months for follow-up.  May need outpatient referral to pulmonary however will defer to PCP. Follow.  4.  Hypertension Patient denies any history of hypertension however has not seen a PCP in several years.  Patient started on Norvasc 5 mg daily with better blood pressure  control.  Continue Norvasc and uptitrate as needed.  Outpatient follow-up with PCP.   5.  Constipation Per RN patient complaining of constipation on 05/30/2020.  Patient given sorbitol p.o with results after the first dose of sorbitol and as such refused second dose of sorbitol.  Continue Colace twice daily.  MiraLAX as needed.    DVT prophylaxis: Heparin Code Status: Full Family Communication: Updated patient.  No family at bedside. Disposition:   Status is: Inpatient    Dispo: The patient is from: Home              Anticipated d/c is to: Home              Anticipated d/c date is: 06/01/2020              Patient currently admitted with acute right-sided PE, on IV heparin, Not stable for discharge.       Consultants:   None  Procedures:   CT angiogram chest 05/28/2020  Chest x-ray 05/27/2020  Lower extremity Dopplers 05/29/2020  2D echo 05/30/2020  Antimicrobials:   IV remdesivir 05/28/2020>>>> 06/02/2020   Subjective: Patient laying in bed.  States pleuritic chest pain has pretty much resolved.  Denies any significant shortness of breath.  Stated ambulated in the room in the hallway without any significant shortness of breath.  Denies any bleeding.  Feels better than on admission.  Stated had significant bowel movement yesterday after first dose of sorbitol/refused second dose of sorbitol.  Objective: Vitals:   05/30/20 1555 05/30/20 2040 05/31/20 0531 05/31/20 0739  BP: (!) 147/89 (!) 138/99 (!) 147/85 (!) 148/92  Pulse: 78 64 70 72  Resp: Temp: 98 F (36.7 C) 97.9 F (36.6 C) 97.9 F (36.6 C) 97.9 F (36.6 C)  TempSrc: Oral Oral Oral Oral  SpO2: 96% 99% 98% 98%  Weight:      Height:        Intake/Output Summary (Last 24 hours) at 05/31/2020 1133 Last data filed at 05/31/2020 0931 Gross per 24 hour  Intake 475.12 ml  Output --  Net 475.12 ml   Filed Weights   05/27/20 1922  Weight: 108.9 kg    Examination:  General exam: NAD Respiratory  system: Clear to auscultation bilaterally.  No wheezes, no crackles, no rhonchi.  Normal respiratory effort.   Cardiovascular system: RRR no murmurs rubs or gallops.  No JVD.  No lower extremity edema.  Gastrointestinal system: Abdomen is soft, nontender, nondistended, positive bowel sounds.  No rebound.  No guarding.  Central nervous system: Alert and oriented. No focal neurological deficits. Extremities: Symmetric 5 x 5 power. Skin: No rashes, lesions or ulcers Psychiatry: Judgement and insight appear normal. Mood & affect appropriate.     Data Reviewed: I have personally reviewed following labs and imaging studies  CBC: Recent Labs  Lab 05/27/20 1925 05/28/20 1200 05/29/20 0616 05/30/20 0723 05/31/20 0446  WBC 10.9* 9.2 10.3 13.4* 11.0*  NEUTROABS  --   --  8.0* 12.1* 9.5*  HGB 13.9 13.9  13.2 13.1 13.4  HCT 41.7 41.9 39.5 38.9* 39.6  MCV 90.3 90.5 91.4 90.0 90.0  PLT 356 366 335 451* 469*    Basic Metabolic Panel: Recent Labs  Lab 05/27/20 1925 05/29/20 0616 05/30/20 0723 05/31/20 0446  NA 138 138 139 141  K 3.9 3.8 4.1 4.3  CL 102 107 107 108  CO2 26 24 23 24   GLUCOSE 139* 114* 141* 128*  BUN 7 14 14 15   CREATININE 0.79 0.81 0.70 0.80  CALCIUM 9.0 8.5* 9.0 9.2  MG  --  2.3 2.2 2.2  PHOS  --  3.4 3.9 3.9    GFR: Estimated Creatinine Clearance: 131.7 mL/min (by C-G formula based on SCr of 0.8 mg/dL).  Liver Function Tests: Recent Labs  Lab 05/29/20 0616 05/30/20 0723 05/31/20 0446  AST 20 27 67*  ALT 33 39 82*  ALKPHOS 88 97 116  BILITOT 0.7 0.8 0.7  PROT 6.7 6.7 6.6  ALBUMIN 3.2* 3.2* 3.1*    CBG: No results for input(s): GLUCAP in the last 168 hours.   Recent Results (from the past 240 hour(s))  SARS Coronavirus 2 by RT PCR (hospital order, performed in [facility] hospital lab) Nasopharyngeal Nasopharyngeal Swab     Status: Abnormal   Collection Time: 05/28/20 12:07 AM   Specimen: Nasopharyngeal Swab  Result Value Ref Range Status   SARS  Coronavirus 2 POSITIVE (A) NEGATIVE Final    Comment: RESULT CALLED TO, READ BACK BY AND VERIFIED WITH: LISA Isley Weisheit RN [id number] 05/28/20 HNM (NOTE) SARS-CoV-2 target nucleic acids are DETECTED  SARS-CoV-2 RNA is generally detectable in upper respiratory specimens  during the acute phase of infection.  Positive results are indicative  of the presence of the identified virus, but do not rule out bacterial infection or co-infection with other pathogens not detected by the test.  Clinical correlation with patient history and  other diagnostic information is necessary to determine patient infection status.  The expected result is negative.  Fact Sheet for Patients:   07/28/20   Fact Sheet for Healthcare Providers:   07/28/20    This test is not yet approved or cleared by the [employer] FDA and  has been authorized for detection and/or diagnosis of SARS-CoV-2 by FDA under an Emergency Use Authorization (EUA).  This EUA will remain in effect (meaning this test  can be used) for the duration of  the COVID-19 declaration under Section 564(b)(1) of the Act, 21 U.S.C. section 360-bbb-3(b)(1), unless the authorization is terminated or revoked sooner.  Performed at [facility], [address] [place], [address] [place]          Radiology Studies: [place] Venous Img Lower Bilateral (DVT)  Result Date: 05/29/2020 CLINICAL DATA:  Pulmonary emboli, evaluate for residual DVT burden. EXAM: BILATERAL LOWER EXTREMITY VENOUS DOPPLER ULTRASOUND TECHNIQUE: Gray-scale sonography with graded compression, as well as color Doppler and duplex ultrasound were performed to evaluate the lower extremity deep venous systems from the level of the common femoral vein and including the common femoral, femoral, profunda femoral, popliteal and calf veins including the posterior tibial, peroneal and gastrocnemius veins when visible. The  superficial great saphenous vein was also interrogated. Spectral Doppler was utilized to evaluate flow at rest and with distal augmentation maneuvers in the common femoral, femoral and popliteal veins. COMPARISON:  None. FINDINGS: RIGHT LOWER EXTREMITY Common Femoral Vein: No evidence of thrombus. Normal compressibility, respiratory phasicity and response to augmentation. Saphenofemoral Junction: No evidence of thrombus. Normal compressibility and  flow on color Doppler imaging. Profunda Femoral Vein: No evidence of thrombus. Normal compressibility and flow on color Doppler imaging. Femoral Vein: No evidence of thrombus. Normal compressibility, respiratory phasicity and response to augmentation. Popliteal Vein: No evidence of thrombus. Normal compressibility, respiratory phasicity and response to augmentation. Calf Veins: No evidence of thrombus. Normal compressibility and flow on color Doppler imaging. Superficial Great Saphenous Vein: No evidence of thrombus. Normal compressibility. Venous Reflux:  None. Other Findings:  None. LEFT LOWER EXTREMITY Common Femoral Vein: No evidence of thrombus. Normal compressibility, respiratory phasicity and response to augmentation. Saphenofemoral Junction: No evidence of thrombus. Normal compressibility and flow on color Doppler imaging. Profunda Femoral Vein: No evidence of thrombus. Normal compressibility and flow on color Doppler imaging. Femoral Vein: No evidence of thrombus. Normal compressibility, respiratory phasicity and response to augmentation. Popliteal Vein: No evidence of thrombus. Normal compressibility, respiratory phasicity and response to augmentation. Calf Veins: No evidence of thrombus. Normal compressibility and flow on color Doppler imaging. Superficial Great Saphenous Vein: No evidence of thrombus. Normal compressibility. Venous Reflux:  None. Other Findings:  None. IMPRESSION: No evidence of deep venous thrombosis in either lower extremity. Electronically  Signed   By: Malachy MoanHeath  McCullough M.D.   On: 05/29/2020 13:29   ECHOCARDIOGRAM COMPLETE  Result Date: 05/30/2020    ECHOCARDIOGRAM REPORT   Patient Name:   Laurence SlateHOMAS Watanabe Date of Exam: 05/30/2020 Medical Rec #:  [id number]     Height:       78.0 in Accession #:    [id number]    Weight:       240.0 lb Date of Birth:  1961-07-13     BSA:          2.439 m Patient Age:    58 years      BP:           145/91 mmHg Patient Gender: M             HR:           72 bpm. Exam Location:  [facility] Procedure: 2D Echo Indications:     Acute Diastolic Heart Failure  History:         Patient has no prior history of Echocardiogram examinations.                  Risk Factors:Hypertension.  Sonographer:     L Thornton-Maynard Referring Phys:  [id number] TOCHUKWU AGBATA Diagnosing Phys: Debbe OdeaBrian Agbor-Etang MD IMPRESSIONS  1. Left ventricular ejection fraction, by estimation, is 60 to 65%. The left ventricle has normal function. The left ventricle has no regional wall motion abnormalities. Left ventricular diastolic parameters are consistent with Grade I diastolic dysfunction (impaired relaxation).  2. Right ventricular systolic function is normal. The right ventricular size is normal.  3. The mitral valve is normal in structure. No evidence of mitral valve regurgitation. No evidence of mitral stenosis.  4. The aortic valve is normal in structure. Aortic valve regurgitation is not visualized. No aortic stenosis is present.  5. The inferior vena cava is normal in size with greater than 50% respiratory variability, suggesting right atrial pressure of 3 mmHg. FINDINGS  Left Ventricle: Left ventricular ejection fraction, by estimation, is 60 to 65%. The left ventricle has normal function. The left ventricle has no regional wall motion abnormalities. The left ventricular internal cavity size was normal in size. There is  no left ventricular hypertrophy. Left ventricular diastolic parameters are consistent with Grade I diastolic dysfunction (impaired  relaxation). Right Ventricle:  The right ventricular size is normal. No increase in right ventricular wall thickness. Right ventricular systolic function is normal. Left Atrium: Left atrial size was normal in size. Right Atrium: Right atrial size was normal in size. Pericardium: There is no evidence of pericardial effusion. Mitral Valve: The mitral valve is normal in structure. Normal mobility of the mitral valve leaflets. No evidence of mitral valve regurgitation. No evidence of mitral valve stenosis. Tricuspid Valve: The tricuspid valve is normal in structure. Tricuspid valve regurgitation is not demonstrated. No evidence of tricuspid stenosis. Aortic Valve: The aortic valve is normal in structure. Aortic valve regurgitation is not visualized. No aortic stenosis is present. Aortic valve mean gradient measures 8.0 mmHg. Aortic valve peak gradient measures 11.8 mmHg. Aortic valve area, by VTI measures 2.29 cm. Pulmonic Valve: The pulmonic valve was not well visualized. Pulmonic valve regurgitation is not visualized. No evidence of pulmonic stenosis. Aorta: The aortic root is normal in size and structure. Venous: The inferior vena cava is normal in size with greater than 50% respiratory variability, suggesting right atrial pressure of 3 mmHg. IAS/Shunts: No atrial level shunt detected by color flow Doppler.  LEFT VENTRICLE PLAX 2D LVIDd:         4.50 cm  Diastology LVIDs:         3.32 cm  LV e' lateral:   8.27 cm/s LV PW:         1.42 cm  LV E/e' lateral: 10.0 LV IVS:        1.22 cm  LV e' medial:    7.18 cm/s LVOT diam:     2.00 cm  LV E/e' medial:  11.5 LV SV:         76 LV SV Index:   31 LVOT Area:     3.14 cm  RIGHT VENTRICLE RV S prime:     16.60 cm/s TAPSE (M-mode): 3.0 cm LEFT ATRIUM             Index LA diam:        2.70 cm 1.11 cm/m LA Vol (A2C):   35.7 ml 14.63 ml/m LA Vol (A4C):   30.4 ml 12.46 ml/m LA Biplane Vol: 33.0 ml 13.53 ml/m  AORTIC VALVE                    PULMONIC VALVE AV Area (Vmax):     2.12 cm     PV Vmax:       1.41 m/s AV Area (Vmean):   2.33 cm     PV Peak grad:  8.0 mmHg AV Area (VTI):     2.29 cm AV Vmax:           172.00 cm/s AV Vmean:          132.000 cm/s AV VTI:            0.330 m AV Peak Grad:      11.8 mmHg AV Mean Grad:      8.0 mmHg LVOT Vmax:         116.00 cm/s LVOT Vmean:        97.900 cm/s LVOT VTI:          0.241 m LVOT/AV VTI ratio: 0.73 MITRAL VALVE MV Area (PHT): 3.74 cm     SHUNTS MV Decel Time: 203 msec     Systemic VTI:  0.24 m MV E velocity: 82.90 cm/s   Systemic Diam: 2.00 cm MV A velocity: 100.00 cm/s MV E/A ratio:  0.83 Debbe Odea MD Electronically signed by Debbe Odea MD Signature Date/Time: 05/30/2020/12:48:41 PM    Final         Scheduled Meds: . albuterol  2 puff Inhalation Q6H  . amLODipine  5 mg Oral Daily  . vitamin C  500 mg Oral Daily  . docusate sodium  100 mg Oral BID  . guaiFENesin  1,200 mg Oral BID  . methylPREDNISolone (SOLU-MEDROL) injection  60 mg Intravenous Q12H  . sodium chloride flush  3 mL Intravenous Q12H  . zinc sulfate  220 mg Oral Daily   Continuous Infusions: . sodium chloride 250 mL (05/31/20 0929)  . heparin 2,000 Units/hr (05/31/20 1028)  . remdesivir 100 mg in NS 100 mL 100 mg (05/31/20 0930)     LOS: 3 days    Time spent: 35 minutes    Ramiro Harvest, MD [facility]   To contact the attending provider between 7A-7P or the covering provider during after hours 7P-7A, please log into the web site [email or web address] and access using universal [place] password for that web site. If you do not have the password, please call the hospital operator.  05/31/2020, 11:33 AM

## 2020-06-01 DIAGNOSIS — K5900 Constipation, unspecified: Secondary | ICD-10-CM

## 2020-06-01 DIAGNOSIS — I10 Essential (primary) hypertension: Secondary | ICD-10-CM

## 2020-06-01 LAB — CBC WITH DIFFERENTIAL/PLATELET
Abs Immature Granulocytes: 0.16 10*3/uL — ABNORMAL HIGH (ref 0.00–0.07)
Basophils Absolute: 0 10*3/uL (ref 0.0–0.1)
Basophils Relative: 0 %
Eosinophils Absolute: 0 10*3/uL (ref 0.0–0.5)
Eosinophils Relative: 0 %
HCT: 40.6 % (ref 39.0–52.0)
Hemoglobin: 13.7 g/dL (ref 13.0–17.0)
Immature Granulocytes: 1 %
Lymphocytes Relative: 10 %
Lymphs Abs: 1.1 10*3/uL (ref 0.7–4.0)
MCH: 30.5 pg (ref 26.0–34.0)
MCHC: 33.7 g/dL (ref 30.0–36.0)
MCV: 90.4 fL (ref 80.0–100.0)
Monocytes Absolute: 0.6 10*3/uL (ref 0.1–1.0)
Monocytes Relative: 5 %
Neutro Abs: 9.6 10*3/uL — ABNORMAL HIGH (ref 1.7–7.7)
Neutrophils Relative %: 84 %
Platelets: 462 10*3/uL — ABNORMAL HIGH (ref 150–400)
RBC: 4.49 MIL/uL (ref 4.22–5.81)
RDW: 13.2 % (ref 11.5–15.5)
WBC: 11.5 10*3/uL — ABNORMAL HIGH (ref 4.0–10.5)
nRBC: 0 % (ref 0.0–0.2)

## 2020-06-01 LAB — COMPREHENSIVE METABOLIC PANEL
ALT: 104 U/L — ABNORMAL HIGH (ref 0–44)
AST: 49 U/L — ABNORMAL HIGH (ref 15–41)
Albumin: 3.4 g/dL — ABNORMAL LOW (ref 3.5–5.0)
Alkaline Phosphatase: 106 U/L (ref 38–126)
Anion gap: 11 (ref 5–15)
BUN: 19 mg/dL (ref 6–20)
CO2: 24 mmol/L (ref 22–32)
Calcium: 9.2 mg/dL (ref 8.9–10.3)
Chloride: 104 mmol/L (ref 98–111)
Creatinine, Ser: 0.83 mg/dL (ref 0.61–1.24)
GFR calc Af Amer: >60 mL/min (ref >60)
GFR calc non Af Amer: >60 mL/min (ref >60)
Glucose, Bld: 123 mg/dL — ABNORMAL HIGH (ref 70–99)
Potassium: 4.1 mmol/L (ref 3.5–5.1)
Sodium: 139 mmol/L (ref 135–145)
Total Bilirubin: 0.8 mg/dL (ref 0.3–1.2)
Total Protein: 6.9 g/dL (ref 6.5–8.1)

## 2020-06-01 LAB — MAGNESIUM: Magnesium: 2.3 mg/dL (ref 1.7–2.4)

## 2020-06-01 LAB — C-REACTIVE PROTEIN: CRP: 1.1 mg/dL — ABNORMAL HIGH (ref <1.0)

## 2020-06-01 LAB — FIBRIN DERIVATIVES D-DIMER (ARMC ONLY): Fibrin derivatives D-dimer (ARMC): 1330.4 ng/mL (FEU) — ABNORMAL HIGH (ref 0.00–499.00)

## 2020-06-01 LAB — PHOSPHORUS: Phosphorus: 5.3 mg/dL — ABNORMAL HIGH (ref 2.5–4.6)

## 2020-06-01 LAB — FERRITIN: Ferritin: 174 ng/mL (ref 24–336)

## 2020-06-01 MED ORDER — ZINC SULFATE 220 (50 ZN) MG PO CAPS: 220.0000 mg | ORAL_CAPSULE | Freq: Every day | ORAL | Status: AC

## 2020-06-01 MED ORDER — GUAIFENESIN ER 600 MG PO TB12: 1200.0000 mg | ORAL_TABLET | Freq: Two times a day (BID) | ORAL | 0 refills | Status: AC

## 2020-06-01 MED ORDER — APIXABAN 5 MG PO TABS: 10.0000 mg | ORAL_TABLET | Freq: Two times a day (BID) | ORAL | 1 refills | Status: AC

## 2020-06-01 MED ORDER — AMLODIPINE BESYLATE 5 MG PO TABS: 5.0000 mg | ORAL_TABLET | Freq: Every day | ORAL | 1 refills | Status: AC

## 2020-06-01 MED ORDER — ACETAMINOPHEN 500 MG PO TABS: 500.0000 mg | ORAL_TABLET | Freq: Four times a day (QID) | ORAL | 0 refills | Status: AC | PRN

## 2020-06-01 MED ORDER — DEXAMETHASONE 6 MG PO TABS: 6.0000 mg | ORAL_TABLET | Freq: Every day | ORAL | 0 refills | Status: AC

## 2020-06-01 MED ORDER — DEXAMETHASONE 4 MG PO TABS
6.0000 mg | ORAL_TABLET | Freq: Every day | ORAL | Status: DC
  Administered 2020-06-01: 15:00:00 6 mg via ORAL
  Filled 2020-06-01: qty 2

## 2020-06-01 MED ORDER — ASCORBIC ACID 500 MG PO TABS: 500.0000 mg | ORAL_TABLET | Freq: Every day | ORAL | Status: AC

## 2020-06-01 MED ORDER — ALBUTEROL SULFATE HFA 108 (90 BASE) MCG/ACT IN AERS: 2.0000 | INHALATION_SPRAY | Freq: Four times a day (QID) | RESPIRATORY_TRACT | 0 refills | Status: AC | PRN

## 2020-06-01 NOTE — TOC Transition Note (Signed)
Transition of Care [facility]) - CM/SW Discharge Note   Patient Details  Name: Keith Johnston MRN: [id number] Date of Birth: [birth year]  [facility]) CM/SW Contact:  Marina Goodell Phone Number: [phone] 06/01/2020, 4:04 PM   Clinical Narrative:     Pt d/c home with [facility] transportation.  [facility] given to Unit Secretary to give to patient.  Referral to [facility] made.  TOC consult complete.         Patient Goals and CMS Choice        Discharge Placement                       Discharge Plan and Services                                     Social Determinants of Health (SDOH) Interventions     Readmission Risk Interventions No flowsheet data found.

## 2020-06-01 NOTE — Progress Notes (Signed)
ANTICOAGULATION CONSULT NOTE - Initial Consult  Pharmacy Consult for Apixaban  Indication: Pulmonary Embolism   No Known Allergies  Patient Measurements: Height: 6\' 6"  (198.1 cm) Weight: 108.9 kg (240 lb) IBW/kg (Calculated) : 91.4  Vital Signs: Temp: 98.1 F (36.7 C) (09/07 1128) Temp Source: Oral (09/07 0746) BP: 135/83 (09/07 1128) Pulse Rate: 72 (09/07 1128)  Labs: Recent Labs    05/30/20 0100 05/30/20 0723 05/30/20 0723 05/31/20 0446 06/01/20 0613  HGB  --  13.1   < > 13.4 13.7  HCT  --  38.9*  --  39.6 40.6  PLT  --  451*  --  469* 462*  HEPARINUNFRC 0.47 0.41  --  0.40  --   CREATININE  --  0.70  --  0.80 0.83   < > = values in this interval not displayed.    Estimated Creatinine Clearance: 126.9 mL/min (by C-G formula based on SCr of 0.83 mg/dL).   Assessment: 59 yo male found to have PE and started on heparin infusion. Pharmacy now consulted for apixaban dosing.   Plan:  Will start apixaban 10mg  BID x 7 days, followed apixban 5mg  BID. Counseled patient over the phone on Eliquis and tube Eliquis handout for nurse to give to patient - nursing area.   Will monitor CBC every 3 days per protocol.  59, PharmD Clinical Pharmacist 06/01/2020 1:13 PM

## 2020-06-01 NOTE — TOC Progression Note (Signed)
Transition of Care [facility]) - Progression Note    Patient Details  Name: Keith Johnston MRN: [id number] Date of Birth: 1960-10-12  [facility]) CM/SW Contact  Marina Goodell Phone Number: [phone] 06/01/2020, 12:42 PM  Clinical Narrative:     Spoke with patient and he stated he will contact his brother Keith Johnston [phone] to pick him up.  CSW stated she would make referral for [facility] for patient to follow-up with PCP.  Patient does not have insurance.  CSW Engineer, agricultural to Diplomatic Services operational officer who will print out and hand it to patient.       Expected Discharge Plan and Services                                                 Social Determinants of Health (SDOH) Interventions    Readmission Risk Interventions No flowsheet data found.

## 2020-06-01 NOTE — Progress Notes (Signed)
Pt d/c to home via [id number]. IVs removed intact. VSS. Education completed. All questions answered. All belongings sent with pt. Application to [facility] sent with pt along with prescriptions and d/c paperwork.

## 2020-06-01 NOTE — Discharge Summary (Signed)
Physician Discharge Summary  Keith Johnston BJY:[id number] DOB: 07-May-1961 DOA: 05/28/2020  PCP: Patient, No Pcp Per  Admit date: 05/28/2020 Discharge date: 06/01/2020  Time spent: 60 minutes  Recommendations for Outpatient Follow-up:  1. Follow-up with PCP in 2 weeks.  On follow-up patient's PE will need to be reassessed.  Patient's blood pressure also need to be reassessed as patient started on Norvasc for blood pressure control.  Patient will need a basic metabolic profile done to follow-up on electrolytes and renal function.  Patient need a CBC done to follow-up on H&H.  Patient will need repeat CT chest done to follow-up on nodule in 6 to 12 months.   Discharge Diagnoses:  Principal Problem:   Pneumonia due to COVID-19 virus Active Problems:   Acute pulmonary embolism (HCC)   Nodule of middle lobe of right lung   Essential hypertension   Constipation   Discharge Condition: Stable and improved  Diet recommendation: Heart healthy  Filed Weights   05/27/20 1922  Weight: 108.9 kg    History of present illness:  HPI per Dr Ivan Anchors is a 59 y.o. male with no significant past medical history who presented to the emergency room for evaluation of right-sided chest pain.  Patient was seen at [facility] last week with similar symptoms.  He ruled out for an acute coronary syndrome during that ER visit and was advised to follow-up with cardiology as an outpatient.  Chest pain is rated 6 x 10 in intensity at its worst and is nonradiating.  It is aggravated by coughing.  Chest pain was also associated with lightheadedness and a productive cough.  Patient denied having any fever, no shortness of breath, no abdominal pain, no nausea, no vomiting or diarrhea. He is unvaccinated against COVID-19 and denied having any sick contacts. Labs show sodium 138, potassium 3.9, chloride 102, bicarb 26, BUN 7, creatinine 0.79, BNP 56.4, troponin V, white count 10.9, hemoglobin 13.9,  hematocrit 41.7, fibrinogen 582, fibrin degradation 2680, His SARS coronavirus 2 PCR test is positive. Chest x-ray reviewed showed haziness in the right lower lung field. CT angiogram shows multifocal acute pulmonary emboli from lobar to subsegmental in size with pulmonary infarct at the right middle lobe. 7 mm right middle lobe pulmonary nodule.  Noncontrast chest CT at 6 to 12 months is recommended. Twelve-lead EKG reviewed by me shows sinus tachycardia   ED Course: Patient is a 59 year old male who presented to the ER for evaluation of right sided chest pain.  Patient had been evaluated a week following for chest pain and ruled out for an acute coronary syndrome.  Chest x-ray showed a right middle lobe infiltrate and a CT angiogram confirmed acute pulmonary embolism with pulmonary infarct.  Patient tested positive for the COVID-19 virus and is unvaccinated.  He was started on a heparin drip in the ER and received remdesivir and Decadron.  He will be admitted to the hospital for further evaluation.  Hospital Course:  1 pneumonia due to COVID-19 virus Presented to the ED with right-sided chest pain ongoing x1 week, ACS ruled out, chest x-ray was concerning for right middle lobe infiltrate.  CT chest done concerning for acute PE and pulmonary infarct.  Patient unvaccinated and tested positive for COVID-19 virus.    Inflammatory markers were ordered and initially were elevated with CRP trending down to CRP  1.1 by day of discharge from 1990 on admission.  Fibrin derivatives were down and currently at 2.5 from 5.7 from 12.3.  Fibrin derivatives at 1330 on day of discharge from 1778 on admission.  Patient was on full dose anticoagulation with IV heparin and subsequently transitioned to oral Eliquis.  Patient also received 5-day course of IV remdesivir, was on IV Solu-Medrol, Mucinex, scheduled albuterol MDI, zinc and vitamin C.  Patient improved clinically during the hospitalization.  Patient with sats of  97% on room air.  Patient be discharged home on 5 days of oral Decadron to complete a 10-day course of treatment.  Outpatient follow-up with PCP.  2.  Acute right-sided pulmonary emboli Questionable etiology.  Patient did state mother with a history of PEs.  Patient denied any recent long car rides or travel, no recent surgeries.  Patient already started on anticoagulation with IV heparin on admission and as such unable to obtain a hypercoagulable panel at this time.  2D echo with normal EF, no right ventricular strain noted.  Lower extremity Dopplers negative for DVT.  Patient initially placed on IV heparin during the hospitalization, 48 to 72 hours and subsequently transition to Eliquis.  Patient improved clinically.  Patient be discharged home on Eliquis.  Patient is ambulatory sats on day of discharge was 94% on room air.  Patient remained asymptomatic.  Outpatient follow-up with PCP.  3.  Nodule of middle lobe right lung CT angiogram chest with 7 mm right middle lobe pulmonary nodule.  Will need repeat CT scans in 6 to 12 months for follow-up.  May need outpatient referral to pulmonary however will defer to PCP.   4.  Hypertension Patient denied any history of hypertension however has not seen a PCP in several years.  Patient started on Norvasc 5 mg daily with better blood pressure control.  Patient was discharged on Norvasc 5 mg daily.  Outpatient follow-up with PCP.   5.  Constipation Per RN patient complaining of constipation on 05/30/2020.  Patient given sorbitol p.o with results after the first dose of sorbitol and as such refused second dose of sorbitol.  Patient maintained on Colace twice daily and MiraLAX were changed to as needed.    Procedures:  CT angiogram chest 05/28/2020  Chest x-ray 05/27/2020  Lower extremity Dopplers 05/29/2020  2D echo 05/30/2020  Consultations:  None  Discharge Exam: Vitals:   06/01/20 0746 06/01/20 1128  BP: (!) 143/86 135/83  Pulse: 67 72  Resp:  18 18  Temp: (!) 97.4 F (36.3 C) 98.1 F (36.7 C)  SpO2: 99% 100%    General: NAD Cardiovascular: RRR Respiratory: CTAB  Discharge Instructions   Discharge Instructions    Diet - low sodium heart healthy   Complete by: As directed    Discharge instructions   Complete by: As directed    ?   Person Under Monitoring Name: Keith Johnston  Location: [address] [place] [address]   Infection Prevention Recommendations for Individuals Confirmed to have, or Being Evaluated for, 2019 Novel Coronavirus (COVID-19) Infection Who Receive Care at Home  Individuals who are confirmed to have, or are being evaluated for, COVID-19 should follow the prevention steps below until a healthcare provider or local or state health department says they can return to normal activities.  Stay home except to get medical care You should restrict activities outside your home, except for getting medical care. Do not go to work, school, or public areas, and do not use public transportation or taxis.  Call ahead before visiting your doctor Before your medical appointment, call the healthcare provider and tell them that you have,  or are being evaluated for, COVID-19 infection. This will help the healthcare provider's office take steps to keep other people from getting infected. Ask your healthcare provider to call the local or state health department.  Monitor your symptoms Seek prompt medical attention if your illness is worsening (e.g., difficulty breathing). Before going to your medical appointment, call the healthcare provider and tell them that you have, or are being evaluated for, COVID-19 infection. Ask your healthcare provider to call the local or state health department.  Wear a facemask You should wear a facemask that covers your nose and mouth when you are in the same room with other people and when you visit a healthcare provider. People who live with or visit you should also wear  a facemask while they are in the same room with you.  Separate yourself from other people in your home As much as possible, you should stay in a different room from other people in your home. Also, you should use a separate bathroom, if available.  Avoid sharing household items You should not share dishes, drinking glasses, cups, eating utensils, towels, bedding, or other items with other people in your home. After using these items, you should wash them thoroughly with soap and water.  Cover your coughs and sneezes Cover your mouth and nose with a tissue when you cough or sneeze, or you can cough or sneeze into your sleeve. Throw used tissues in a lined trash can, and immediately wash your hands with soap and water for at least 20 seconds or use an alcohol-based hand rub.  Wash your [employer] your hands often and thoroughly with soap and water for at least 20 seconds. You can use an alcohol-based hand sanitizer if soap and water are not available and if your hands are not visibly dirty. Avoid touching your eyes, nose, and mouth with unwashed hands.   Prevention Steps for Caregivers and Household Members of Individuals Confirmed to have, or Being Evaluated for, COVID-19 Infection Being Cared for in the Home  If you live with, or provide care at home for, a person confirmed to have, or being evaluated for, COVID-19 infection please follow these guidelines to prevent infection:  Follow healthcare provider's instructions Make sure that you understand and can help the patient follow any healthcare provider instructions for all care.  Provide for the patient's basic needs You should help the patient with basic needs in the home and provide support for getting groceries, prescriptions, and other personal needs.  Monitor the patient's symptoms If they are getting sicker, call his or her medical provider and tell them that the patient has, or is being evaluated for, COVID-19 infection.  This will help the healthcare provider's office take steps to keep other people from getting infected. Ask the healthcare provider to call the local or state health department.  Limit the number of people who have contact with the patient If possible, have only one caregiver for the patient. Other household members should stay in another home or place of residence. If this is not possible, they should stay in another room, or be separated from the patient as much as possible. Use a separate bathroom, if available. Restrict visitors who do not have an essential need to be in the home.  Keep older adults, very young children, and other sick people away from the patient Keep older adults, very young children, and those who have compromised immune systems or chronic health conditions away from the patient. This includes people  with chronic heart, lung, or kidney conditions, diabetes, and cancer.  Ensure good ventilation Make sure that shared spaces in the home have good air flow, such as from an air conditioner or an opened window, weather permitting.  Wash your hands often Wash your hands often and thoroughly with soap and water for at least 20 seconds. You can use an alcohol based hand sanitizer if soap and water are not available and if your hands are not visibly dirty. Avoid touching your eyes, nose, and mouth with unwashed hands. Use disposable paper towels to dry your hands. If not available, use dedicated cloth towels and replace them when they become wet.  Wear a facemask and gloves Wear a disposable facemask at all times in the room and gloves when you touch or have contact with the patient's blood, body fluids, and/or secretions or excretions, such as sweat, saliva, sputum, nasal mucus, vomit, urine, or feces.  Ensure the mask fits over your nose and mouth tightly, and do not touch it during use. Throw out disposable facemasks and gloves after using them. Do not reuse. Wash your hands  immediately after removing your facemask and gloves. If your personal clothing becomes contaminated, carefully remove clothing and launder. Wash your hands after handling contaminated clothing. Place all used disposable facemasks, gloves, and other waste in a lined container before disposing them with other household waste. Remove gloves and wash your hands immediately after handling these items.  Do not share dishes, glasses, or other household items with the patient Avoid sharing household items. You should not share dishes, drinking glasses, cups, eating utensils, towels, bedding, or other items with a patient who is confirmed to have, or being evaluated for, COVID-19 infection. After the person uses these items, you should wash them thoroughly with soap and water.  Wash laundry thoroughly Immediately remove and wash clothes or bedding that have blood, body fluids, and/or secretions or excretions, such as sweat, saliva, sputum, nasal mucus, vomit, urine, or feces, on them. Wear gloves when handling laundry from the patient. Read and follow directions on labels of laundry or clothing items and detergent. In general, wash and dry with the warmest temperatures recommended on the label.  Clean all areas the individual has used often Clean all touchable surfaces, such as counters, tabletops, doorknobs, bathroom fixtures, toilets, phones, keyboards, tablets, and bedside tables, every day. Also, clean any surfaces that may have blood, body fluids, and/or secretions or excretions on them. Wear gloves when cleaning surfaces the patient has come in contact with. Use a diluted bleach solution (e.g., dilute bleach with 1 part bleach and 10 parts water) or a household disinfectant with a label that says EPA-registered for coronaviruses. To make a bleach solution at home, add 1 tablespoon of bleach to 1 quart (4 cups) of water. For a larger supply, add  cup of bleach to 1 gallon (16 cups) of water. Read  labels of cleaning products and follow recommendations provided on product labels. Labels contain instructions for safe and effective use of the cleaning product including precautions you should take when applying the product, such as wearing gloves or eye protection and making sure you have good ventilation during use of the product. Remove gloves and wash hands immediately after cleaning.  Monitor yourself for signs and symptoms of illness Caregivers and household members are considered close contacts, should monitor their health, and will be asked to limit movement outside of the home to the extent possible. Follow the monitoring steps for close contacts  listed on the symptom monitoring form.   ? If you have additional questions, contact your local health department or call the epidemiologist on call at [phone] (available 24/7). ? This guidance is subject to change. For the most up-to-date guidance from [facility], please refer to their website: [employer]Metro.hu   Increase activity slowly   Complete by: As directed    Increase activity slowly   Complete by: As directed    MyChart COVID-19 home monitoring program   Complete by: Jun 01, 2020    Is the patient willing to use the MyChart Mobile App for home monitoring?: Yes   Temperature monitoring   Complete by: Jun 01, 2020    After how many days would you like to receive a notification of this patient's flowsheet entries?: 1     Allergies as of 06/01/2020   No Known Allergies     Medication List    TAKE these medications   acetaminophen 500 MG tablet Commonly known as: TYLENOL Take 1 tablet (500 mg total) by mouth every 6 (six) hours as needed for moderate pain, fever or headache.   albuterol 108 (90 Base) MCG/ACT inhaler Commonly known as: VENTOLIN HFA Inhale 2 puffs into the lungs every 6 (six) hours as needed for wheezing or shortness of breath.   amLODipine 5 MG  tablet Commonly known as: NORVASC Take 1 tablet (5 mg total) by mouth daily. Start taking on: June 02, 2020   apixaban 5 MG Tabs tablet Commonly known as: ELIQUIS Take 2 tablets (10 mg total) by mouth 2 (two) times daily. Take 2 tablets (10mg ) 2 times daily x 1 week, then 1 tablet (5mg ) 2 times daily.   ascorbic acid 500 MG tablet Commonly known as: VITAMIN C Take 1 tablet (500 mg total) by mouth daily. Start taking on: June 02, 2020   dexamethasone 6 MG tablet Commonly known as: DECADRON Take 1 tablet (6 mg total) by mouth daily for 5 days. Start taking on: June 02, 2020   guaiFENesin 600 MG 12 hr tablet Commonly known as: MUCINEX Take 2 tablets (1,200 mg total) by mouth 2 (two) times daily for 5 days.   zinc sulfate 220 (50 Zn) MG capsule Take 1 capsule (220 mg total) by mouth daily. Start taking on: June 02, 2020      No Known Allergies  Follow-up Information    pcp. Schedule an appointment as soon as possible for a visit in 2 week(s).                The results of significant diagnostics from this hospitalization (including imaging, microbiology, ancillary and laboratory) are listed below for reference.    Significant Diagnostic Studies: DG Chest 2 View  Result Date: 05/27/2020 CLINICAL DATA:  Chest pain EXAM: CHEST - 2 VIEW COMPARISON:  05/18/2020 FINDINGS: Patchy airspace disease in the lingula has developed in the interval. No effusion. Right lung clear. Mild pulmonary hyperinflation. Heart size and vascularity normal. IMPRESSION: Interval development of lingular infiltrate.  Possible pneumonia. Electronically Signed   By: Marlan Palau M.D.   On: 05/27/2020 19:49   DG Chest 2 View  Result Date: 05/18/2020 CLINICAL DATA:  Chest pain and dizziness EXAM: CHEST - 2 VIEW COMPARISON:  None. FINDINGS: Lungs are clear. Heart size and pulmonary vascularity are normal. No adenopathy. No pneumothorax. There is mild degenerative change in the thoracic  spine. IMPRESSION: Lungs clear.  Cardiac silhouette within normal limits. Electronically Signed   By: Bretta Bang III M.D.  On: 05/18/2020 07:49   CT Angio Chest PE W/Cm &/Or Wo Cm  Result Date: 05/28/2020 CLINICAL DATA:  Right-sided chest pain EXAM: CT ANGIOGRAPHY CHEST WITH CONTRAST TECHNIQUE: Multidetector CT imaging of the chest was performed using the standard protocol during bolus administration of intravenous contrast. Multiplanar CT image reconstructions and MIPs were obtained to evaluate the vascular anatomy. CONTRAST:  75mL OMNIPAQUE IOHEXOL 350 MG/ML SOLN COMPARISON:  None. FINDINGS: Cardiovascular: Normal heart size. No right heart dilatation. Mild coronary calcification. Positive for pulmonary artery filling defects seen at the right middle lobar artery, posterior segment right upper lobe, subsegmental posterior basal segment left lower lobe, and anterior basal segment right lower lobe. Mediastinum/Nodes:   Negative for adenopathy or mass. Lungs/Pleura: Geographic area of ground-glass opacity in the right middle lobe, consistent with ischemia/infarct. Mild ischemic changes may also be present in the right lower lobe. Atelectasis on both sides. History of COVID, no typical ground-glass opacities. No pleural effusion or failure. 7 mm right middle lobe pulmonary nodule. Upper Abdomen: No acute finding in the arterial phase. A cystic density is seen in the posterior right liver with water densitometry on coronal reformats. Musculoskeletal: No acute or aggressive finding. Review of the MIP images confirms the above findings. Critical Value/emergent results were called by telephone at the time of interpretation on 05/28/2020 at 5:02 am to provider JADE SUNG , who verbally acknowledged these results. IMPRESSION: 1. Multifocal acute pulmonary emboli from lobar to subsegmental in size with pulmonary infarct at the right middle lobe. 2. 7 mm right middle lobe pulmonary nodule. Non-contrast chest CT at  6-12 months is recommended. If the nodule is stable at time of repeat CT, then future CT at 18-24 months (from today's scan) is considered optional for low-risk patients, but is recommended for high-risk patients. This recommendation follows the consensus statement: Guidelines for Management of Incidental Pulmonary Nodules Detected on CT Images: From the Fleischner Society 2017; Radiology 2017; 284:228-243. 3. Coronary atherosclerosis. Electronically Signed   By: Marnee Spring M.D.   On: 05/28/2020 05:03   US Venous Img Lower Bilateral (DVT)  Result Date: 05/29/2020 CLINICAL DATA:  Pulmonary emboli, evaluate for residual DVT burden. EXAM: BILATERAL LOWER EXTREMITY VENOUS DOPPLER ULTRASOUND TECHNIQUE: Gray-scale sonography with graded compression, as well as color Doppler and duplex ultrasound were performed to evaluate the lower extremity deep venous systems from the level of the common femoral vein and including the common femoral, femoral, profunda femoral, popliteal and calf veins including the posterior tibial, peroneal and gastrocnemius veins when visible. The superficial great saphenous vein was also interrogated. Spectral Doppler was utilized to evaluate flow at rest and with distal augmentation maneuvers in the common femoral, femoral and popliteal veins. COMPARISON:  None. FINDINGS: RIGHT LOWER EXTREMITY Common Femoral Vein: No evidence of thrombus. Normal compressibility, respiratory phasicity and response to augmentation. Saphenofemoral Junction: No evidence of thrombus. Normal compressibility and flow on color Doppler imaging. Profunda Femoral Vein: No evidence of thrombus. Normal compressibility and flow on color Doppler imaging. Femoral Vein: No evidence of thrombus. Normal compressibility, respiratory phasicity and response to augmentation. Popliteal Vein: No evidence of thrombus. Normal compressibility, respiratory phasicity and response to augmentation. Calf Veins: No evidence of thrombus.  Normal compressibility and flow on color Doppler imaging. Superficial Great Saphenous Vein: No evidence of thrombus. Normal compressibility. Venous Reflux:  None. Other Findings:  None. LEFT LOWER EXTREMITY Common Femoral Vein: No evidence of thrombus. Normal compressibility, respiratory phasicity and response to augmentation. Saphenofemoral Junction: No evidence of thrombus. Normal  compressibility and flow on color Doppler imaging. Profunda Femoral Vein: No evidence of thrombus. Normal compressibility and flow on color Doppler imaging. Femoral Vein: No evidence of thrombus. Normal compressibility, respiratory phasicity and response to augmentation. Popliteal Vein: No evidence of thrombus. Normal compressibility, respiratory phasicity and response to augmentation. Calf Veins: No evidence of thrombus. Normal compressibility and flow on color Doppler imaging. Superficial Great Saphenous Vein: No evidence of thrombus. Normal compressibility. Venous Reflux:  None. Other Findings:  None. IMPRESSION: No evidence of deep venous thrombosis in either lower extremity. Electronically Signed   By: Malachy Moan M.D.   On: 05/29/2020 13:29   ECHOCARDIOGRAM COMPLETE  Result Date: 05/30/2020    ECHOCARDIOGRAM REPORT   Patient Name:   Keith Johnston Date of Exam: 05/30/2020 Medical Rec #:  [id number]     Height:       78.0 in Accession #:    [id number]    Weight:       240.0 lb Date of Birth:  1961/05/29     BSA:          2.439 m Patient Age:    58 years      BP:           145/91 mmHg Patient Gender: M             HR:           72 bpm. Exam Location:  [facility] Procedure: 2D Echo Indications:     Acute Diastolic Heart Failure  History:         Patient has no prior history of Echocardiogram examinations.                  Risk Factors:Hypertension.  Sonographer:     L Thornton-Maynard Referring Phys:  [id number] Diagnosing Phys: Debbe Odea MD IMPRESSIONS  1. Left ventricular ejection fraction, by estimation, is 60 to  65%. The left ventricle has normal function. The left ventricle has no regional wall motion abnormalities. Left ventricular diastolic parameters are consistent with Grade I diastolic dysfunction (impaired relaxation).  2. Right ventricular systolic function is normal. The right ventricular size is normal.  3. The mitral valve is normal in structure. No evidence of mitral valve regurgitation. No evidence of mitral stenosis.  4. The aortic valve is normal in structure. Aortic valve regurgitation is not visualized. No aortic stenosis is present.  5. The inferior vena cava is normal in size with greater than 50% respiratory variability, suggesting right atrial pressure of 3 mmHg. FINDINGS  Left Ventricle: Left ventricular ejection fraction, by estimation, is 60 to 65%. The left ventricle has normal function. The left ventricle has no regional wall motion abnormalities. The left ventricular internal cavity size was normal in size. There is  no left ventricular hypertrophy. Left ventricular diastolic parameters are consistent with Grade I diastolic dysfunction (impaired relaxation). Right Ventricle: The right ventricular size is normal. No increase in right ventricular wall thickness. Right ventricular systolic function is normal. Left Atrium: Left atrial size was normal in size. Right Atrium: Right atrial size was normal in size. Pericardium: There is no evidence of pericardial effusion. Mitral Valve: The mitral valve is normal in structure. Normal mobility of the mitral valve leaflets. No evidence of mitral valve regurgitation. No evidence of mitral valve stenosis. Tricuspid Valve: The tricuspid valve is normal in structure. Tricuspid valve regurgitation is not demonstrated. No evidence of tricuspid stenosis. Aortic Valve: The aortic valve is normal in structure. Aortic valve regurgitation is  not visualized. No aortic stenosis is present. Aortic valve mean gradient measures 8.0 mmHg. Aortic valve peak gradient measures  11.8 mmHg. Aortic valve area, by VTI measures 2.29 cm. Pulmonic Valve: The pulmonic valve was not well visualized. Pulmonic valve regurgitation is not visualized. No evidence of pulmonic stenosis. Aorta: The aortic root is normal in size and structure. Venous: The inferior vena cava is normal in size with greater than 50% respiratory variability, suggesting right atrial pressure of 3 mmHg. IAS/Shunts: No atrial level shunt detected by color flow Doppler.  LEFT VENTRICLE PLAX 2D LVIDd:         4.50 cm  Diastology LVIDs:         3.32 cm  LV e' lateral:   8.27 cm/s LV PW:         1.42 cm  LV E/e' lateral: 10.0 LV IVS:        1.22 cm  LV e' medial:    7.18 cm/s LVOT diam:     2.00 cm  LV E/e' medial:  11.5 LV SV:         76 LV SV Index:   31 LVOT Area:     3.14 cm  RIGHT VENTRICLE RV S prime:     16.60 cm/s TAPSE (M-mode): 3.0 cm LEFT ATRIUM             Index LA diam:        2.70 cm 1.11 cm/m LA Vol (A2C):   35.7 ml 14.63 ml/m LA Vol (A4C):   30.4 ml 12.46 ml/m LA Biplane Vol: 33.0 ml 13.53 ml/m  AORTIC VALVE                    PULMONIC VALVE AV Area (Vmax):    2.12 cm     PV Vmax:       1.41 m/s AV Area (Vmean):   2.33 cm     PV Peak grad:  8.0 mmHg AV Area (VTI):     2.29 cm AV Vmax:           172.00 cm/s AV Vmean:          132.000 cm/s AV VTI:            0.330 m AV Peak Grad:      11.8 mmHg AV Mean Grad:      8.0 mmHg LVOT Vmax:         116.00 cm/s LVOT Vmean:        97.900 cm/s LVOT VTI:          0.241 m LVOT/AV VTI ratio: 0.73 MITRAL VALVE MV Area (PHT): 3.74 cm     SHUNTS MV Decel Time: 203 msec     Systemic VTI:  0.24 m MV E velocity: 82.90 cm/s   Systemic Diam: 2.00 cm MV A velocity: 100.00 cm/s MV E/A ratio:  0.83 Debbe OdeaBrian Agbor-Etang MD Electronically signed by Debbe OdeaBrian Agbor-Etang MD Signature Date/Time: 05/30/2020/12:48:41 PM    Final     Microbiology: Recent Results (from the past 240 hour(s))  SARS Coronavirus 2 by RT PCR (hospital order, performed [facility]) Nasopharyngeal  Nasopharyngeal Swab     Status: Abnormal   Collection Time: 05/28/20 12:07 AM   Specimen: Nasopharyngeal Swab  Result Value Ref Range Status   SARS Coronavirus 2 POSITIVE (A) NEGATIVE Final    Comment: RESULT CALLED TO, READ BACK BY AND VERIFIED WITH: Kathyrn SheriffLISA Pearline Yerby RN [id number] 05/28/20 HNM (NOTE) SARS-CoV-2 target nucleic acids  are DETECTED  SARS-CoV-2 RNA is generally detectable in upper respiratory specimens  during the acute phase of infection.  Positive results are indicative  of the presence of the identified virus, but do not rule out bacterial infection or co-infection with other pathogens not detected by the test.  Clinical correlation with patient history and  other diagnostic information is necessary to determine patient infection status.  The expected result is negative.  Fact Sheet for Patients:   BoilerBrush.com.cy   Fact Sheet for Healthcare Providers:   https://pope.com/    This test is not yet approved or cleared by the [place] FDA and  has been authorized for detection and/or diagnosis of SARS-CoV-2 by FDA under an Emergency Use Authorization (EUA).  This EUA will remain in effect (meaning this test  can be used) for the duration of  the COVID-19 declaration under Section 564(b)(1) of the Act, 21 U.S.C. section 360-bbb-3(b)(1), unless the authorization is terminated or revoked sooner.  Performed at [facility], [address] [place], [place] [address]      Labs: Basic Metabolic Panel: Recent Labs  Lab 05/27/20 1925 05/29/20 0616 05/30/20 0723 05/31/20 0446 06/01/20 0613  NA 138 138 139 141 139  K 3.9 3.8 4.1 4.3 4.1  CL 102 107 107 108 104  CO2 GLUCOSE 139* 114* 141* 128* 123*  BUN CREATININE 0.79 0.81 0.70 0.80 0.83  CALCIUM 9.0 8.5* 9.0 9.2 9.2  MG  --  2.3 2.2 2.2 2.3  PHOS  --  3.4 3.9 3.9 5.3*   Liver Function Tests: Recent Labs  Lab  05/29/20 0616 05/30/20 0723 05/31/20 0446 06/01/20 0613  AST 20 27 67* 49*  ALT 33 39 82* 104*  ALKPHOS 88 97 116 106  BILITOT 0.7 0.8 0.7 0.8  PROT 6.7 6.7 6.6 6.9  ALBUMIN 3.2* 3.2* 3.1* 3.4*   No results for input(s): LIPASE, AMYLASE in the last 168 hours. No results for input(s): AMMONIA in the last 168 hours. CBC: Recent Labs  Lab 05/28/20 1200 05/29/20 0616 05/30/20 0723 05/31/20 0446 06/01/20 0613  WBC 9.2 10.3 13.4* 11.0* 11.5*  NEUTROABS  --  8.0* 12.1* 9.5* 9.6*  HGB 13.9 13.2 13.1 13.4 13.7  HCT 41.9 39.5 38.9* 39.6 40.6  MCV 90.5 91.4 90.0 90.0 90.4  PLT 366 335 451* 469* 462*   Cardiac Enzymes: No results for input(s): CKTOTAL, CKMB, CKMBINDEX, TROPONINI in the last 168 hours. BNP: BNP (last 3 results) Recent Labs    05/28/20 0539  BNP 56.4    ProBNP (last 3 results) No results for input(s): PROBNP in the last 8760 hours.  CBG: No results for input(s): GLUCAP in the last 168 hours.     Signed:  Ramiro Harvest MD.  [facility] 06/01/2020, 3:13 PM

## 2020-06-03 ENCOUNTER — Telehealth: Payer: Self-pay | Admitting: General Practice

## 2020-06-10 ENCOUNTER — Telehealth: Payer: Self-pay | Admitting: Gerontology

## 2020-06-10 NOTE — Telephone Encounter (Signed)
-----   Message from Willette Brace sent at 06/08/2020 10:30 AM EDT ----- Regarding: FW: [facility] referral Attempted call on 9/14. Left message. ----- Message ----- From: Willette Brace Sent: 06/03/2020   9:45 AM EDT To: Rodney Langton Subject: FW: [facility] referral                              Attempted call on 9/9. Left message ----- Message ----- From: Benjamin Stain, CMA Sent: 06/02/2020   8:53 AM EDT To: Rodney Langton Subject: [facility] referral                                  Referral from [facility]. Has ODC application  Please call patient to review eligibility and necessary paperwork.

## 2020-06-10 NOTE — Telephone Encounter (Signed)
Left a message regarding [facility] application and told him to call back if he had any questions regarding eligibility. Arlina Robes

## 2020-06-24 NOTE — Telephone Encounter (Signed)
LVM @2 :30 on 06/24/20 about becoming a new pt-KW

## 2021-01-26 ENCOUNTER — Other Ambulatory Visit: Payer: Self-pay

## 2021-01-26 ENCOUNTER — Encounter: Payer: Self-pay | Admitting: Emergency Medicine

## 2021-01-26 ENCOUNTER — Emergency Department: Payer: Self-pay

## 2021-01-26 ENCOUNTER — Emergency Department
Admission: EM | Admit: 2021-01-26 | Discharge: 2021-01-26 | Disposition: A | Discharge status: Home / Self Care | Payer: Self-pay | Attending: Emergency Medicine | Admitting: Emergency Medicine

## 2021-01-26 DIAGNOSIS — Z7901 Long term (current) use of anticoagulants: Secondary | ICD-10-CM | POA: Insufficient documentation

## 2021-01-26 DIAGNOSIS — Z8616 Personal history of COVID-19: Secondary | ICD-10-CM | POA: Insufficient documentation

## 2021-01-26 DIAGNOSIS — W101XXA Fall (on)(from) sidewalk curb, initial encounter: Secondary | ICD-10-CM | POA: Insufficient documentation

## 2021-01-26 DIAGNOSIS — Z79899 Other long term (current) drug therapy: Secondary | ICD-10-CM | POA: Insufficient documentation

## 2021-01-26 DIAGNOSIS — S92902A Unspecified fracture of left foot, initial encounter for closed fracture: Secondary | ICD-10-CM

## 2021-01-26 DIAGNOSIS — S93325A Dislocation of tarsometatarsal joint of left foot, initial encounter: Secondary | ICD-10-CM | POA: Insufficient documentation

## 2021-01-26 DIAGNOSIS — F17210 Nicotine dependence, cigarettes, uncomplicated: Secondary | ICD-10-CM | POA: Insufficient documentation

## 2021-01-26 DIAGNOSIS — T1490XA Injury, unspecified, initial encounter: Secondary | ICD-10-CM

## 2021-01-26 DIAGNOSIS — I10 Essential (primary) hypertension: Secondary | ICD-10-CM | POA: Insufficient documentation

## 2021-01-26 MED ORDER — HYDROMORPHONE HCL 1 MG/ML IJ SOLN
1.0000 mg | Freq: Once | INTRAMUSCULAR | Status: DC
  Filled 2021-01-26: qty 1

## 2021-01-26 MED ORDER — HYDROMORPHONE HCL 1 MG/ML IJ SOLN
1.0000 mg | Freq: Once | INTRAMUSCULAR | Status: AC
  Administered 2021-01-26: 1 mg via INTRAMUSCULAR

## 2021-01-26 MED ORDER — OXYCODONE-ACETAMINOPHEN 5-325 MG PO TABS: 1.0000 | ORAL_TABLET | Freq: Four times a day (QID) | ORAL | 0 refills | Status: AC | PRN

## 2021-01-26 MED ORDER — OXYCODONE-ACETAMINOPHEN 7.5-325 MG PO TABS
1.0000 | ORAL_TABLET | Freq: Once | ORAL | Status: AC
  Administered 2021-01-26: 1 via ORAL
  Filled 2021-01-26: qty 1

## 2021-01-26 MED ORDER — KETOROLAC TROMETHAMINE 30 MG/ML IJ SOLN
30.0000 mg | Freq: Once | INTRAMUSCULAR | Status: AC
  Administered 2021-01-26: 30 mg via INTRAMUSCULAR
  Filled 2021-01-26: qty 1

## 2021-01-26 NOTE — ED Notes (Signed)
Ice pack placed on pt left foot

## 2021-01-26 NOTE — Discharge Instructions (Signed)
Ice and elevation to reduce swelling frequently and especially at bedtime.  Elevate your foot on several pillows and apply ice packs to your foot to help reduce swelling.  A prescription for pain medication was sent to the pharmacy this is 1 or 2 every 6 hours as needed for pain.  Be aware that this medication could cause drowsiness and increase your risk for injury.  Absolutely no weightbearing on your left foot.  You will need to use crutches anytime you are up.  Also elevate your foot anytime you are sitting.  The more you have your foot hanging down the more it will swell.  Call [facility] podiatry to make arrangements to be seen in the office and evaluated.  They may give you information about podiatry at [facility] and [facility] and you will get this information with your visit to Dr. Excell Seltzer.

## 2021-01-26 NOTE — ED Triage Notes (Signed)
Left foot injury, slipped off of curb and heard something pop in left foot.  C/O pain and swelling to foot today.

## 2021-01-26 NOTE — ED Provider Notes (Signed)
[facility] Emergency Department Provider Note   ____________________________________________   Event Date/Time   First MD Initiated Contact with Patient 01/26/21 1226     (approximate)  I have reviewed the triage vital signs and the nursing notes.   HISTORY  Chief Complaint Foot Injury    HPI Keith Johnston is a 60 y.o. male presents to the ED with complaint of left foot pain after an injury that occurred last night.  Patient states he slipped off the curb and heard something pop.  This morning when he woke up his foot was extremely swollen and unable to bear weight due to pain.  He denies any head injury with this fall.  He rates his pain as 6 out of 10.       History reviewed. No pertinent past medical history.  Patient Active Problem List   Diagnosis Date Noted  . Essential hypertension 05/30/2020  . Constipation 05/30/2020  . Pneumonia due to COVID-19 virus 05/28/2020  . Acute pulmonary embolism (HCC) 05/28/2020  . 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 Sepsis affecting skin   . Cellulitis of left lower extremity   . Cellulitis 04/28/2015    Past Surgical History:  Procedure Laterality Date  . INCISION AND DRAINAGE ABSCESS Left 05/01/2015   Procedure: INCISION AND DRAINAGE ABSCESS;  Surgeon: Natale Lay, MD;  Location: [facility] ORS;  Service: General;  Laterality: Left;    Prior to Admission medications   Medication Sig Start Date End Date Taking? Authorizing Provider  oxyCODONE-acetaminophen (PERCOCET) 5-325 MG tablet Take 1-2 tablets by mouth every 6 (six) hours as needed for severe pain. 01/26/21 01/26/22 Yes Tommi Rumps, PA-C  acetaminophen (TYLENOL) 500 MG tablet Take 1 tablet (500 mg total) by mouth every 6 (six) hours as needed for moderate pain, fever or headache. 06/01/20   Rodolph Bong, MD  albuterol (VENTOLIN HFA) 108 (90 Base) MCG/ACT inhaler Inhale 2 puffs into the lungs every 6 (six) hours as needed for wheezing or  shortness of breath. 06/01/20   Rodolph Bong, MD  amLODipine (NORVASC) 5 MG tablet Take 1 tablet (5 mg total) by mouth daily. 06/02/20   Rodolph Bong, MD  apixaban (ELIQUIS) 5 MG TABS tablet Take 2 tablets (10 mg total) by mouth 2 (two) times daily. Take 2 tablets (10mg ) 2 times daily x 1 week, then 1 tablet (5mg ) 2 times daily. 06/01/20   , MD  ascorbic acid (VITAMIN C) 500 MG tablet Take 1 tablet (500 mg total) by mouth daily. 06/02/20   Rodolph Bong, MD  zinc sulfate 220 (50 Zn) MG capsule Take 1 capsule (220 mg total) by mouth daily. 06/02/20   Rodolph Bong, MD    Allergies Patient has no known allergies.  No family history on file.  Social History Social History   Tobacco Use  . Smoking status: Current Every Day Smoker    Packs/day: 1.00    Types: Cigarettes  . Smokeless tobacco: Never Used  Substance Use Topics  . Alcohol use: Yes    Alcohol/week: 12.0 standard drinks    Types: 12 Cans of beer per week  . Drug use: No    Review of Systems Constitutional: No fever/chills Eyes: No visual changes. ENT: No trauma. Cardiovascular: Denies chest pain. Respiratory: Denies shortness of breath. Gastrointestinal: No abdominal pain.  No nausea, no vomiting. Genitourinary: Negative for dysuria. Musculoskeletal: Positive left foot pain. Skin: Positive for abrasions. Neurological: Negative for  headaches, focal weakness or numbness. ____________________________________________   PHYSICAL EXAM:  VITAL SIGNS: ED Triage Vitals  Enc Vitals Group     BP 01/26/21 1142 128/90     Pulse Rate 01/26/21 1142 (!) 124     Resp 01/26/21 1142 20     Temp 01/26/21 1142 98.1 F (36.7 C)     Temp Source 01/26/21 1142 Oral     SpO2 01/26/21 1142 97 %     Weight 01/26/21 1139 240 lb 1.3 oz (108.9 kg)     Height 01/26/21 1139 6\' 6"  (1.981 m)     Head Circumference --      Peak Flow --      Pain Score 01/26/21 1139 6     Pain Loc --      Pain Edu? --       Excl. in GC? --     Constitutional: Alert and oriented. Well appearing and in no acute distress. Eyes: Conjunctivae are normal.  Head: Atraumatic. Nose: No congestion/rhinnorhea. Mouth/Throat: Mucous membranes are moist.  Oropharynx non-erythematous. Neck: No stridor.   Cardiovascular: Normal rate, regular rhythm. Grossly normal heart sounds.  Good peripheral circulation. Respiratory: Normal respiratory effort.  No retractions. Lungs CTAB. Gastrointestinal: Soft and nontender. No distention. No abdominal bruits. No CVA tenderness. Musculoskeletal: No lower extremity tenderness nor edema.  No joint effusions. Neurologic:  Normal speech and language. No gross focal neurologic deficits are appreciated. No gait instability. Skin:  Skin is warm, dry and intact. No rash noted. Psychiatric: Mood and affect are normal. Speech and behavior are normal.  ____________________________________________   LABS (all labs ordered are listed, but only abnormal results are displayed)  Labs Reviewed - No data to display  RADIOLOGY I, 03/28/21, personally viewed and evaluated these images (plain radiographs) as part of my medical decision making, as well as reviewing the written report by the radiologist.   Official radiology report(s): DG Ankle Complete Left  Result Date: 01/26/2021 CLINICAL DATA:  Pain and swelling after fall EXAM: LEFT ANKLE COMPLETE - 3+ VIEW COMPARISON:  None. FINDINGS: Frontal, oblique, and lateral views were obtained. There is soft tissue swelling. No evident fracture or joint effusion. Joint spaces appear normal. No erosion. There is a small posterior calcaneal spur. Ankle mortise appears intact. IMPRESSION: Soft tissue swelling. No evident fracture. No appreciable joint space narrowing. There is a posterior calcaneal spur. Ankle mortise appears intact. Electronically Signed   By: 03/28/2021 III M.D.   On: 01/26/2021 12:29   CT Foot Left Wo Contrast  Result Date:  01/26/2021 CLINICAL DATA:  Fractures along the Lisfranc joint EXAM: CT OF THE LEFT FOOT WITHOUT CONTRAST TECHNIQUE: Multidetector CT imaging of the left foot was performed according to the standard protocol. Multiplanar CT image reconstructions were also generated. COMPARISON:  Radiographs 01/26/2021 FINDINGS: Bones/Joint/Cartilage As noted on radiography there are fractures likely leading to instability at the Lisfranc joint. This includes comminuted fracture the base of the second metatarsal involving the proximal articular surface and proximal metaphysis, including a small fragment forming the footplate of the Lisfranc ligament attachment, with 6 mm cephalad displacement of the dominant shaft fragment. Comminuted fracture of the plantar base of the third metatarsal with mild superior subluxation of the third metatarsal with respect to the middle cuneiform, and questionable avulsion along the dorsal distal middle cuneiform; plantar-medial avulsion of the base of the fourth metatarsal extending into the articular surface with some dorsal fragments noted along the articular margin; a fracture of the  distal-lateral corner of the cuboid near the articulation with the fifth metatarsal; and faint calcifications dorsal to the first tarsometatarsal articulation. In addition there is an oblique fracture of the distal head of the fifth metatarsal. Healing fracture of the proximal phalanx of the fifth toe with osteoid deposition indicating subacute chronicity. Age indeterminate avulsion/calcification along the anterolateral portion of the anterior process of the calcaneus in the vicinity of the extensor digitorum brevis attachment, favoring age-indeterminate avulsion. Chronic well corticated fragmented spur from the proximal-dorsal navicular along the talonavicular articulation. Plantar and Achilles calcaneal spurs. No hindfoot fracture identified. Ligaments Suboptimally assessed by CT. However, the Lisfranc ligament appears  to be attached to a small fragment from the base of the second metatarsal and as such is likely not exerting a biomechanically significant stabilizing force on the forefoot. Muscles and Tendons Suspected common peroneus tendon sheath tenosynovitis. Mild thickening of the medial band of the plantar fascia could indicate plantar fasciitis. Soft tissues Expected subcutaneous edema especially tracking in the dorsum of the foot also in to the toes. Edema tracks laterally in the ankle. IMPRESSION: 1. Multiple fractures along the Lisfranc joint, likely to cause Lisfranc joint instability, as noted above. 2. In addition to the metatarsal base fractures and fractures involving the cuboid and potentially middle cuneiform, there is an acute oblique fracture of the distal head of the fifth metatarsal as well as a subacute fracture of the proximal phalanx of the fifth toe. 3. Age-indeterminate avulsion from the lateral portion of the anterior process of the calcaneus in the vicinity of the extensor digitorum brevis attachment site. 4. Scattered regional soft tissue swelling. 5. Common peroneus tendon sheath tenosynovitis. 6. Thickened medial band of the plantar fascia, potentially reflecting plantar fasciitis. Electronically Signed   By: Gaylyn Rong M.D.   On: 01/26/2021 14:10   DG Foot Complete Left  Result Date: 01/26/2021 CLINICAL DATA:  Trip and fall with foot pain, initial encounter EXAM: LEFT FOOT - COMPLETE 3+ VIEW COMPARISON:  None. FINDINGS: There is a fracture through the base of the second meta tarsal identified. The base of the second metatarsal is appropriately aligned with the second cuneiform however the more distal fracture fragment is somewhat laterally displaced. Irregularity at the base of the third and fourth metatarsals is noted as well suspicious for fracture. Some irregularity of the cuboid bone laterally is noted as well. Healing fracture in the fifth proximal phalanx is noted of a more remote  history. IMPRESSION: Definitive fracture at the base of the second metatarsal with findings suspicious for fractures through the bases of the third and fourth metatarsals as well as fracture involving the cuboid bone. CT would be helpful for further evaluation Electronically Signed   By: Alcide Clever M.D.   On: 01/26/2021 12:32    ____________________________________________   PROCEDURES  Procedure(s) performed (including Critical Care):  Procedures   ____________________________________________   INITIAL IMPRESSION / ASSESSMENT AND PLAN / ED COURSE  As part of my medical decision making, I reviewed the following data within the electronic MEDICAL RECORD NUMBER Notes from prior ED visits and [place] Controlled Substance Database  60 year old male presents to the ED after a fall last night and he slipped off the curb.  Patient states that he thought that he had a sprained ankle and went to bed.  He has not taken any over-the-counter medication nor has he had ice on his foot.  This morning he woke with increased pain and swelling.  Physical exam is highly suspicious for  fracture of his left foot and x-rays showed multiple fractures.  A CT scan was performed to further evaluate and patient does have multiple fractures and Lisfranc injury.  Dr[facility] Baker, who [facility] was notified and will see the patient to evaluate.  Patient was given Percocet 7.5 while in the ED and Dilaudid 1 mg IM.  A OCL splint was applied to his left foot and patient is aware that he needs to ice and elevate frequently to reduce the swelling.  He was offered a walker but preferred crutches.  He is aware that he is not to weight-bear on his foot.  He is also aware that the pain medication may cause drowsiness and increase his risk for injury.  Percocet 5 mg 1 or 2 every 6 hours as needed for pain.  Ice and elevation frequently.  He is to return to the emergency department if any worsening of his symptoms  or urgent concerns.  ____________________________________________   FINAL CLINICAL IMPRESSION(S) / ED DIAGNOSES  Final diagnoses:  Trauma  Multiple closed fractures of left foot, initial encounter  Lisfranc dislocation, left, initial encounter     ED Discharge Orders         Ordered    oxyCODONE-acetaminophen (PERCOCET) 5-325 MG tablet  Every 6 hours PRN        01/26/21 1619          *Please note:  Laurence Slatehomas Dicesare was evaluated in Emergency Department on 01/26/2021 for the symptoms described in the history of present illness. He was evaluated in the context of the global COVID-19 pandemic, which necessitated consideration that the patient might be at risk for infection with the SARS-CoV-2 virus that causes COVID-19. Institutional protocols and algorithms that pertain to the evaluation of patients at risk for COVID-19 are in a state of rapid change based on information released by regulatory bodies including the CDC and federal and state organizations. These policies and algorithms were followed during the patient's care in the ED.  Some ED evaluations and interventions may be delayed as a result of limited staffing during and the pandemic.*   Note:  This document was prepared using Dragon voice recognition software and may include unintentional dictation errors.    Tommi RumpsSummers, Theona Muhs L, PA-C 01/26/21 1629    Shaune PollackIsaacs, [place], MD 01/29/21 1504

## 2021-01-26 NOTE — ED Notes (Signed)
D/C, new RX and reasons to return to ED discussed with pt, pt verbalized understanding.NAD noted.   Pt educated on importance of elevating leg and not to drive on narcotics.

## 2021-07-29 IMAGING — CT CT FOOT*L* W/O CM
1 series · 12 of 14 positions shown, 15 images · non-contrast
Comparison: Radiographs 01/26/2021

CLINICAL DATA: Fractures along the Lisfranc joint

EXAM:
CT OF THE LEFT FOOT WITHOUT CONTRAST
TECHNIQUE: Multidetector CT imaging of the left foot was performed according to
the standard protocol. Multiplanar CT image reconstructions were
also generated.

[Series 10: cor st · axial · 0.39mm/px · z∈[-509,-329]mm · 12 of 256 slices shown, 15 images]
[im 20/256  soft-tissue]
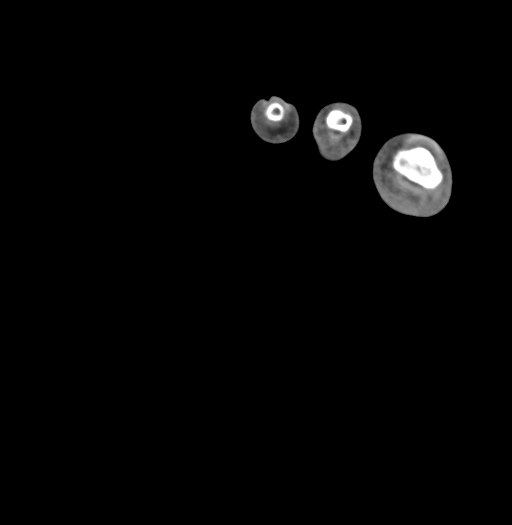
[im 20/256  bone]
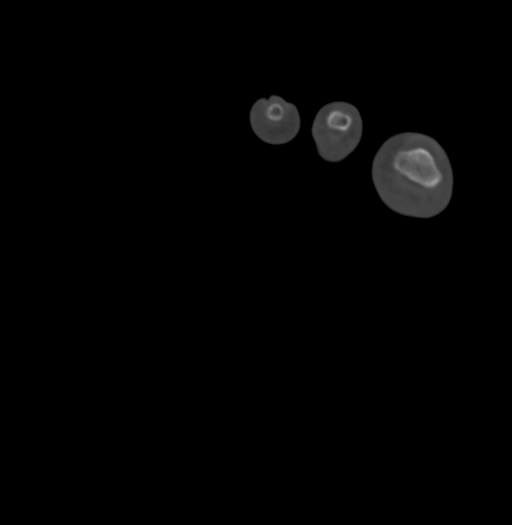
[im 40/256  bone]
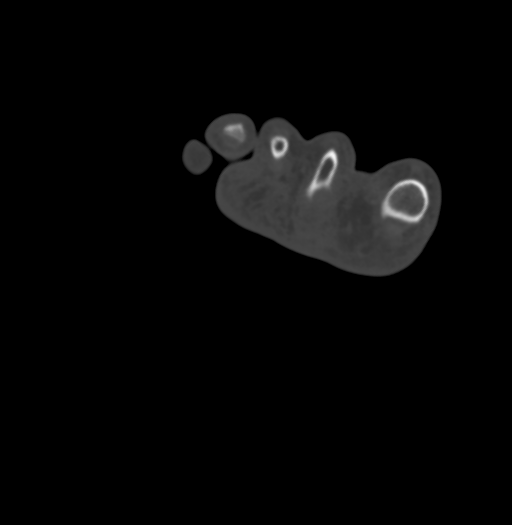
[im 59/256  bone]
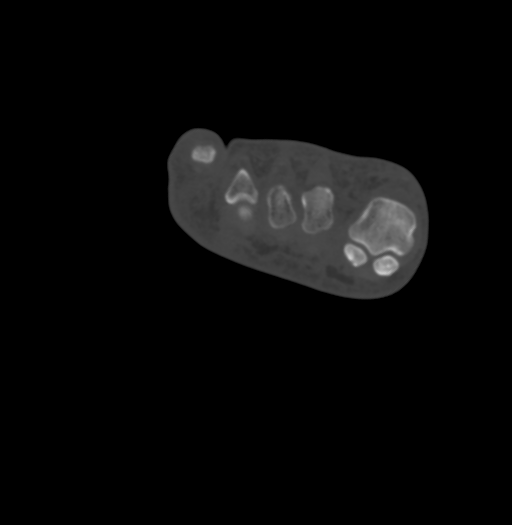
[im 79/256  bone]
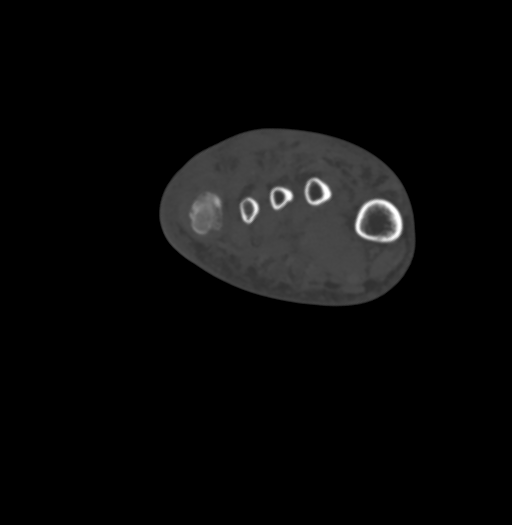
[im 99/256  soft-tissue]
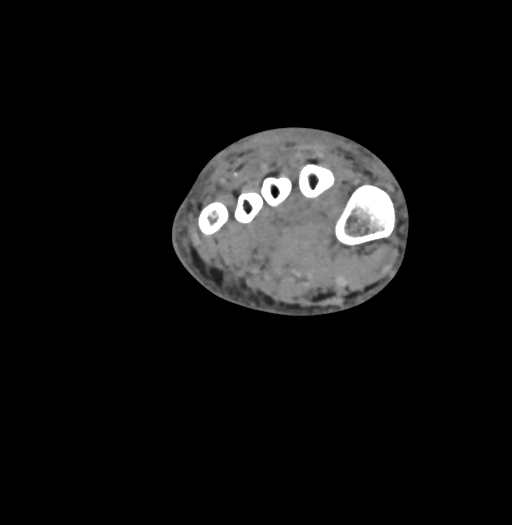
[im 99/256  bone]
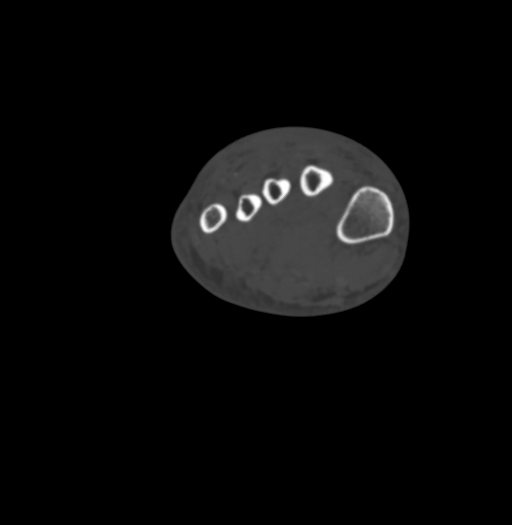
[im 118/256  bone]
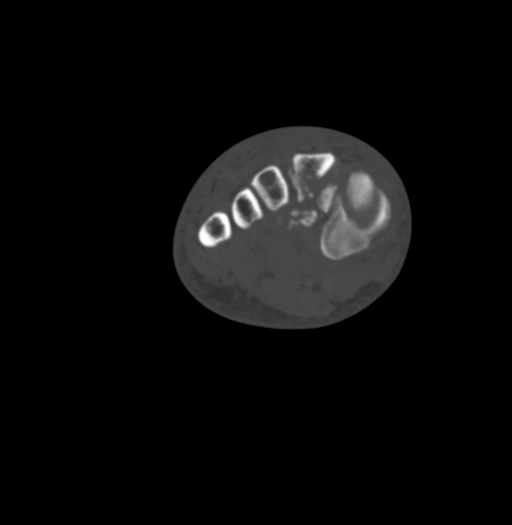
[im 138/256  bone]
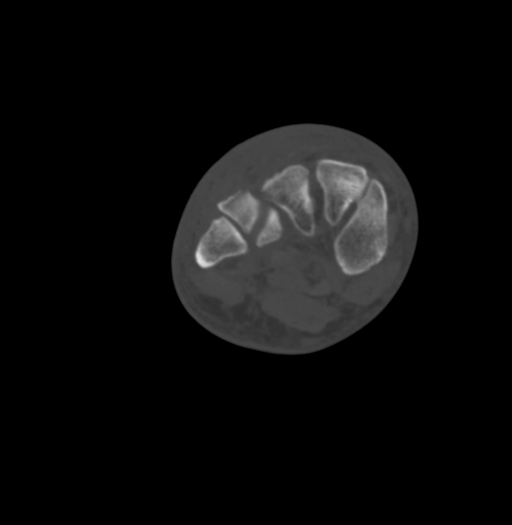
[im 157/256  bone]
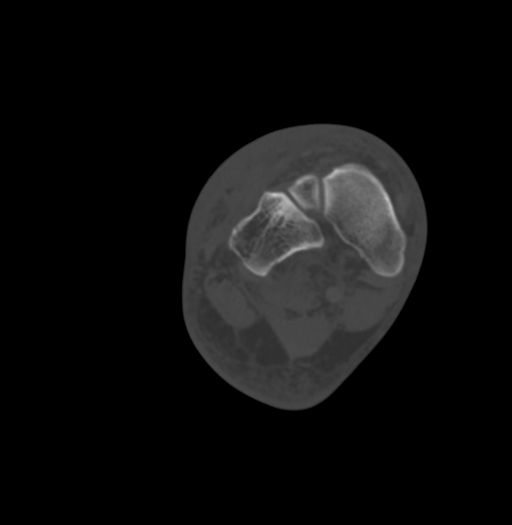
[im 177/256  soft-tissue]
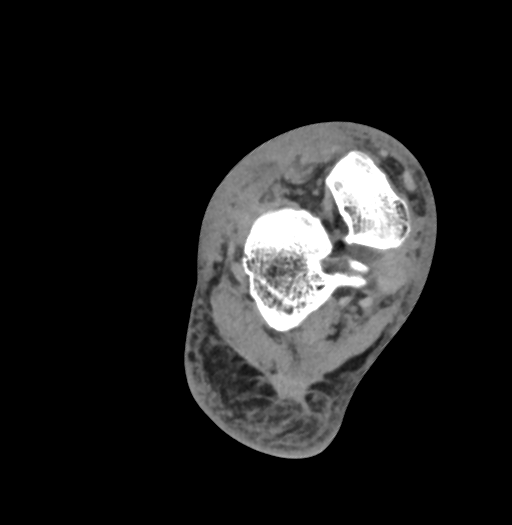
[im 177/256  bone]
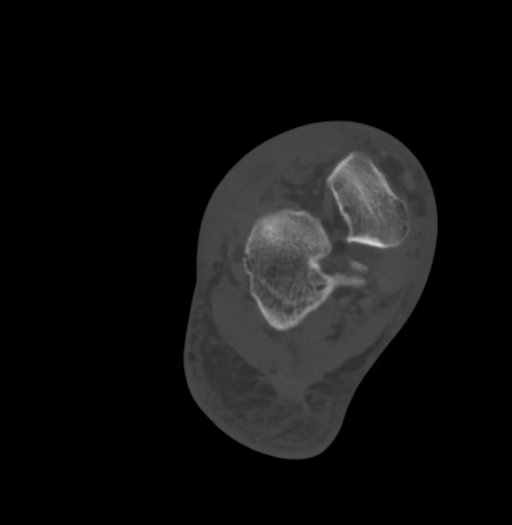
[im 197/256  bone]
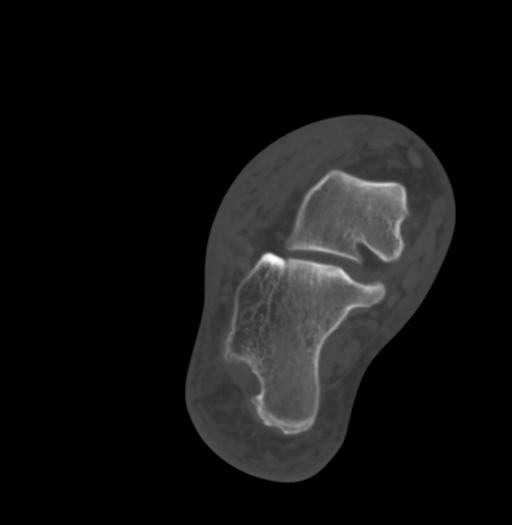
[im 216/256  bone]
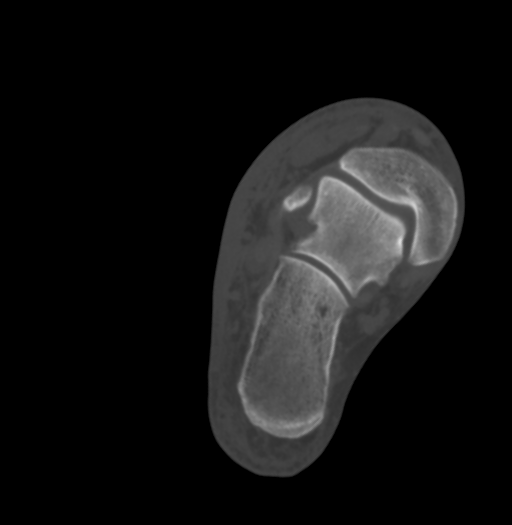
[im 236/256  bone]
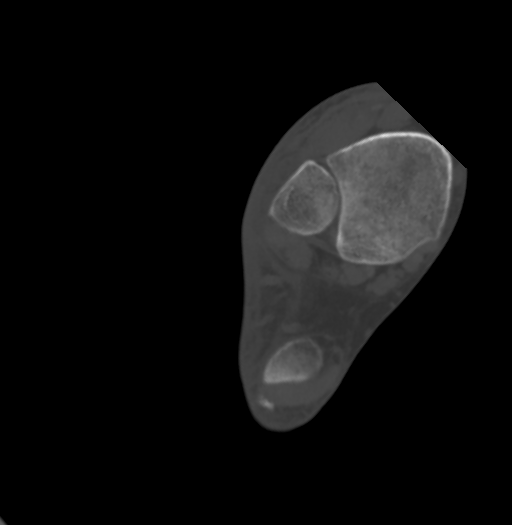

[12 of 14 positions shown; findings below may reference images not displayed]

FINDINGS: Bones/Joint/Cartilage

As noted on radiography there are fractures likely leading to
instability at the Lisfranc joint. This includes comminuted fracture
the base of the second metatarsal involving the proximal articular
surface and proximal metaphysis, including a small fragment forming
the footplate of the Lisfranc ligament attachment, with 6 mm
cephalad displacement of the dominant shaft fragment. Comminuted
fracture of the plantar base of the third metatarsal with mild
superior subluxation of the third metatarsal with respect to the
middle cuneiform, and questionable avulsion along the dorsal distal
middle cuneiform; plantar-medial avulsion of the base of the fourth
metatarsal extending into the articular surface with some dorsal
fragments noted along the articular margin; a fracture of the
distal-lateral corner of the cuboid near the articulation with the
fifth metatarsal; and faint calcifications dorsal to the first
tarsometatarsal articulation.

In addition there is an oblique fracture of the distal head of the
fifth metatarsal. Healing fracture of the proximal phalanx of the
fifth toe with osteoid deposition indicating subacute chronicity.

Age indeterminate avulsion/calcification along the anterolateral
portion of the anterior process of the calcaneus in the vicinity of
the extensor digitorum brevis attachment, favoring age-indeterminate
avulsion.

Chronic well corticated fragmented spur from the proximal-dorsal
navicular along the talonavicular articulation.

Plantar and Achilles calcaneal spurs. No hindfoot fracture
identified.

Ligaments

Suboptimally assessed by CT. However, the Lisfranc ligament appears
to be attached to a small fragment from the base of the second
metatarsal and as such is likely not exerting a biomechanically
significant stabilizing force on the forefoot.

Muscles and Tendons

Suspected common peroneus tendon sheath tenosynovitis. Mild
thickening of the medial band of the plantar fascia could indicate
plantar fasciitis.

Soft tissues

Expected subcutaneous edema especially tracking in the dorsum of the
foot also in to the toes. Edema tracks laterally in the ankle.
IMPRESSION: 1. Multiple fractures along the Lisfranc joint, likely to cause
Lisfranc joint instability, as noted above.
2. In addition to the metatarsal base fractures and fractures
involving the cuboid and potentially middle cuneiform, there is an
acute oblique fracture of the distal head of the fifth metatarsal as
well as a subacute fracture of the proximal phalanx of the fifth
toe.
3. Age-indeterminate avulsion from the lateral portion of the
anterior process of the calcaneus in the vicinity of the extensor
digitorum brevis attachment site.
4. Scattered regional soft tissue swelling.
5. Common peroneus tendon sheath tenosynovitis.
6. Thickened medial band of the plantar fascia, potentially
reflecting plantar fasciitis.
